# Patient Record
Sex: Female | Born: 1935 | Race: White | Hispanic: No | Marital: Single | Smoking: Never smoker
Health system: Southern US, Community
[De-identification: ages and names within clinical notes are randomized; demographics above are authoritative.]

## PROBLEM LIST (undated history)

## (undated) DIAGNOSIS — I10 Essential (primary) hypertension: Secondary | ICD-10-CM

## (undated) DIAGNOSIS — R569 Unspecified convulsions: Secondary | ICD-10-CM

## (undated) DIAGNOSIS — E785 Hyperlipidemia, unspecified: Secondary | ICD-10-CM

## (undated) DIAGNOSIS — F0390 Unspecified dementia without behavioral disturbance: Secondary | ICD-10-CM

## (undated) DIAGNOSIS — C4490 Unspecified malignant neoplasm of skin, unspecified: Secondary | ICD-10-CM

## (undated) HISTORY — PX: MITRAL VALVE REPLACEMENT: SHX147

---

## 2016-04-25 ENCOUNTER — Emergency Department (HOSPITAL_COMMUNITY): Payer: Medicare Other

## 2016-04-25 ENCOUNTER — Emergency Department (HOSPITAL_COMMUNITY)
Admission: EM | Admit: 2016-04-25 | Discharge: 2016-04-25 | Disposition: A | Discharge status: Home / Self Care | Payer: Medicare Other | Attending: Emergency Medicine | Admitting: Emergency Medicine

## 2016-04-25 ENCOUNTER — Encounter (HOSPITAL_COMMUNITY): Payer: Self-pay | Admitting: Emergency Medicine

## 2016-04-25 DIAGNOSIS — R791 Abnormal coagulation profile: Secondary | ICD-10-CM | POA: Insufficient documentation

## 2016-04-25 DIAGNOSIS — Z7901 Long term (current) use of anticoagulants: Secondary | ICD-10-CM | POA: Insufficient documentation

## 2016-04-25 DIAGNOSIS — R600 Localized edema: Secondary | ICD-10-CM | POA: Insufficient documentation

## 2016-04-25 DIAGNOSIS — N289 Disorder of kidney and ureter, unspecified: Secondary | ICD-10-CM | POA: Diagnosis not present

## 2016-04-25 DIAGNOSIS — R609 Edema, unspecified: Secondary | ICD-10-CM

## 2016-04-25 DIAGNOSIS — Z79899 Other long term (current) drug therapy: Secondary | ICD-10-CM | POA: Diagnosis not present

## 2016-04-25 DIAGNOSIS — I10 Essential (primary) hypertension: Secondary | ICD-10-CM | POA: Insufficient documentation

## 2016-04-25 DIAGNOSIS — M7989 Other specified soft tissue disorders: Secondary | ICD-10-CM | POA: Diagnosis present

## 2016-04-25 HISTORY — DX: Essential (primary) hypertension: I10

## 2016-04-25 HISTORY — DX: Unspecified dementia, unspecified severity, without behavioral disturbance, psychotic disturbance, mood disturbance, and anxiety: F03.90

## 2016-04-25 LAB — CBC WITH DIFFERENTIAL/PLATELET
BASOS ABS: 0.1 10*3/uL (ref 0.0–0.1)
BASOS PCT: 1 %
EOS PCT: 4 %
Eosinophils Absolute: 0.2 10*3/uL (ref 0.0–0.7)
HCT: 38 % (ref 36.0–46.0)
Hemoglobin: 12 g/dL (ref 12.0–15.0)
Lymphocytes Relative: 19 %
Lymphs Abs: 1.2 10*3/uL (ref 0.7–4.0)
MCH: 28.2 pg (ref 26.0–34.0)
MCHC: 31.6 g/dL (ref 30.0–36.0)
MCV: 89.4 fL (ref 78.0–100.0)
MONO ABS: 0.5 10*3/uL (ref 0.1–1.0)
Monocytes Relative: 8 %
Neutro Abs: 4.2 10*3/uL (ref 1.7–7.7)
Neutrophils Relative %: 68 %
PLATELETS: 326 10*3/uL (ref 150–400)
RBC: 4.25 MIL/uL (ref 3.87–5.11)
RDW: 15 % (ref 11.5–15.5)
WBC: 6.2 10*3/uL (ref 4.0–10.5)

## 2016-04-25 LAB — URINALYSIS, ROUTINE W REFLEX MICROSCOPIC
Bilirubin Urine: NEGATIVE
GLUCOSE, UA: NEGATIVE mg/dL
Hgb urine dipstick: NEGATIVE
Ketones, ur: NEGATIVE mg/dL
LEUKOCYTES UA: NEGATIVE
Nitrite: NEGATIVE
PH: 5 (ref 5.0–8.0)
Protein, ur: NEGATIVE mg/dL
SPECIFIC GRAVITY, URINE: 1.016 (ref 1.005–1.030)

## 2016-04-25 LAB — COMPREHENSIVE METABOLIC PANEL
ALT: 15 U/L (ref 14–54)
AST: 32 U/L (ref 15–41)
Albumin: 3.4 g/dL — ABNORMAL LOW (ref 3.5–5.0)
Alkaline Phosphatase: 120 U/L (ref 38–126)
Anion gap: 6 (ref 5–15)
BILIRUBIN TOTAL: 1 mg/dL (ref 0.3–1.2)
BUN: 32 mg/dL — AB (ref 6–20)
CO2: 26 mmol/L (ref 22–32)
CREATININE: 1.38 mg/dL — AB (ref 0.44–1.00)
Calcium: 8.8 mg/dL — ABNORMAL LOW (ref 8.9–10.3)
Chloride: 109 mmol/L (ref 101–111)
GFR calc Af Amer: 39 mL/min — ABNORMAL LOW (ref >60)
GFR, EST NON AFRICAN AMERICAN: 33 mL/min — AB (ref >60)
GLUCOSE: 104 mg/dL — AB (ref 65–99)
Potassium: 3.7 mmol/L (ref 3.5–5.1)
Sodium: 141 mmol/L (ref 135–145)
TOTAL PROTEIN: 7.1 g/dL (ref 6.5–8.1)

## 2016-04-25 LAB — BRAIN NATRIURETIC PEPTIDE: B Natriuretic Peptide: 106.6 pg/mL — ABNORMAL HIGH (ref 0.0–100.0)

## 2016-04-25 LAB — PROTIME-INR
INR: 1.49
PROTHROMBIN TIME: 18.2 s — AB (ref 11.4–15.2)

## 2016-04-25 NOTE — ED Notes (Signed)
No questions at time of discharge. Discharge information given to family and [facility]

## 2016-04-25 NOTE — ED Provider Notes (Signed)
[facility] DEPT Provider Note   CSN: [id number] Arrival date & time: 04/25/16  1833     History   Chief Complaint Chief Complaint  Patient presents with  . Trouble Urinating  . Leg Swelling    HPI Lisa Maddox is a 83 y.o. female.  HPI level V caveat dementia history is obtained the patient's son. Patient son speaks fluent polish. Patient speaks [place] We attempted to get professional medical interpreter however interpreter line malfunctioned no interpreter available. Son acted as Astronomer. Patient has had difficulty urinating for the past 2-3 days. Call urinating small frequent amounts. Patient's son has also noticed bilateral leg swelling for the past 2 days. Patient states she feels well. She denies any difficulty breathing. Denies cough. Denies pain anywhere. She moved to [place] to assisted-living facility 1 month ago from [place].  Past Medical History:  Diagnosis Date  . Dementia   . Hypertension     There are no active problems to display for this patient.   Past Surgical History:  Procedure Laterality Date  . MITRAL VALVE REPLACEMENT      OB History    No data available       Home Medications    Prior to Admission medications   Medication Sig Start Date End Date Taking? Authorizing Provider  acetaminophen (TYLENOL) 500 MG tablet Take 500 mg by mouth every 4 (four) hours as needed for mild pain, fever or headache.   Yes Historical Provider, MD  alum & mag hydroxide-simeth (MINTOX) 200-200-20 MG/5ML suspension Take 30 mLs by mouth as needed for indigestion or heartburn.   Yes Historical Provider, MD  atorvastatin (LIPITOR) 10 MG tablet Take 10 mg by mouth daily.   Yes Historical Provider, MD  chlorthalidone (HYGROTON) 50 MG tablet Take 50 mg by mouth daily.   Yes Historical Provider, MD  guaifenesin (ROBITUSSIN) 100 MG/5ML syrup Take 200 mg by mouth every 6 (six) hours as needed for cough.   Yes Historical Provider, MD  levETIRAcetam (KEPPRA) 250  MG tablet Take 250 mg by mouth 2 (two) times daily.   Yes Historical Provider, MD  loperamide (IMODIUM) 2 MG capsule Take 2 mg by mouth as needed for diarrhea or loose stools.   Yes Historical Provider, MD  losartan (COZAAR) 50 MG tablet Take 50 mg by mouth daily.   Yes Historical Provider, MD  magnesium hydroxide (MILK OF MAGNESIA) 400 MG/5ML suspension Take 30 mLs by mouth at bedtime as needed for mild constipation.   Yes Historical Provider, MD  memantine (NAMENDA) 5 MG tablet Take 5 mg by mouth 2 (two) times daily.   Yes Historical Provider, MD  metoprolol succinate (TOPROL-XL) 50 MG 24 hr tablet Take 75 mg by mouth daily. Take with or immediately following a meal.   Yes Historical Provider, MD  Multiple Vitamins-Minerals (PRESERVISION AREDS PO) Take 1 capsule by mouth 2 (two) times daily.   Yes Historical Provider, MD  neomycin-bacitracin-polymyxin (NEOSPORIN) [phone] ointment Apply 1 application topically as needed (skin tears, abrasions).   Yes Historical Provider, MD  PRESCRIPTION MEDICATION Take 1 Can by mouth 2 (two) times daily with a meal. Mighty Shakes   Yes Historical Provider, MD  spironolactone (ALDACTONE) 25 MG tablet Take 25 mg by mouth daily.   Yes Historical Provider, MD  warfarin (COUMADIN) 3 MG tablet Take 3 mg by mouth 3 (three) times a week. Take 3mg  by mouth on Tuesday, Thursday, and Saturday   Yes Historical Provider, MD  warfarin (COUMADIN) 4 MG tablet Take  4 mg by mouth as directed. Take 4mg  by mouth on Sunday, Monday, Wednesday, and Friday   Yes Historical Provider, MD    Family History No family history on file.  Social History Social History  Substance Use Topics  . Smoking status: Never Smoker  . Smokeless tobacco: Never Used  . Alcohol use No   Lives in assisted living facility  Allergies   Patient has no known allergies.   Review of Systems Review of Systems  Unable to perform ROS: Dementia  Cardiovascular: Positive for leg swelling.    Genitourinary: Positive for dysuria.     Physical Exam Updated Vital Signs BP 120/88 (BP Location: Left Arm)   Pulse 78   Temp 97.7 F (36.5 C) (Oral)   Resp 16   Ht 5\' 1"  (1.549 m)   Wt 137 lb 1.6 oz (62.2 kg)   SpO2 97%   BMI 25.90 kg/m   Physical Exam  Constitutional: She appears well-developed and well-nourished. No distress.  Pleasant and cooperative  HENT:  Head: Normocephalic and atraumatic.  Eyes: Conjunctivae are normal. Pupils are equal, round, and reactive to light.  Neck: Neck supple. No tracheal deviation present. No thyromegaly present.  Cardiovascular: Normal rate and regular rhythm.   No murmur heard. Pulmonary/Chest: Effort normal and breath sounds normal.  Abdominal: Soft. Bowel sounds are normal. She exhibits no distension. There is no tenderness.  Musculoskeletal: Normal range of motion. She exhibits edema. She exhibits no tenderness.  +1 pretibial pitting edema bilaterally  Neurological: She is alert. Coordination normal.  Skin: Skin is warm and dry. No rash noted.  Psychiatric: She has a normal mood and affect.  Nursing note and vitals reviewed.    ED Treatments / Results  Labs (all labs ordered are listed, but only abnormal results are displayed) Labs Reviewed  URINALYSIS, ROUTINE W REFLEX MICROSCOPIC    EKG  EKG Interpretation None      Chest x-ray viewed by me Radiology Dg Chest 2 View  Result Date: 04/25/2016 CLINICAL DATA:  Acute onset of difficulty urinating. Pitting edema. Initial encounter. EXAM: CHEST  2 VIEW COMPARISON:  None. FINDINGS: The lungs are hypoexpanded. Peribronchial thickening is noted. Minimal left basilar opacity could reflect mild pneumonia, depending on the patient's symptoms. There is no evidence of pleural effusion or pneumothorax. The heart is mildly enlarged. The patient is status post median sternotomy. No acute osseous abnormalities are seen. IMPRESSION: 1. Lungs hypoexpanded. Peribronchial thickening.  Minimal left basilar opacity could reflect mild pneumonia, depending on the patient's symptoms. 2. Mild cardiomegaly. Electronically Signed   By: Garald Balding M.D.   On: 04/25/2016 19:36    Procedures Procedures (including critical care time)  Medications Ordered in ED Medications - No data to display   Results for orders placed or performed during the hospital encounter of 04/25/16  Urinalysis, Routine w reflex microscopic- may I&O cath if menses  Result Value Ref Range   Color, Urine YELLOW YELLOW   APPearance CLEAR CLEAR   Specific Gravity, Urine 1.016 1.005 - 1.030   pH 5.0 5.0 - 8.0   Glucose, UA NEGATIVE NEGATIVE mg/dL   Hgb urine dipstick NEGATIVE NEGATIVE   Bilirubin Urine NEGATIVE NEGATIVE   Ketones, ur NEGATIVE NEGATIVE mg/dL   Protein, ur NEGATIVE NEGATIVE mg/dL   Nitrite NEGATIVE NEGATIVE   Leukocytes, UA NEGATIVE NEGATIVE  Comprehensive metabolic panel  Result Value Ref Range   Sodium 141 135 - 145 mmol/L   Potassium 3.7 3.5 - 5.1 mmol/L  Chloride 109 101 - 111 mmol/L   CO2 26 22 - 32 mmol/L   Glucose, Bld 104 (H) 65 - 99 mg/dL   BUN 32 (H) 6 - 20 mg/dL   Creatinine, Ser 1.38 (H) 0.44 - 1.00 mg/dL   Calcium 8.8 (L) 8.9 - 10.3 mg/dL   Total Protein 7.1 6.5 - 8.1 g/dL   Albumin 3.4 (L) 3.5 - 5.0 g/dL   AST 32 15 - 41 U/L   ALT 15 14 - 54 U/L   Alkaline Phosphatase 120 38 - 126 U/L   Total Bilirubin 1.0 0.3 - 1.2 mg/dL   GFR calc non Af Amer 33 (L) >60 mL/min   GFR calc Af Amer 39 (L) >60 mL/min   Anion gap 6 5 - 15  CBC with Differential/Platelet  Result Value Ref Range   WBC 6.2 4.0 - 10.5 K/uL   RBC 4.25 3.87 - 5.11 MIL/uL   Hemoglobin 12.0 12.0 - 15.0 g/dL   HCT 38.0 36.0 - 46.0 %   MCV 89.4 78.0 - 100.0 fL   MCH 28.2 26.0 - 34.0 pg   MCHC 31.6 30.0 - 36.0 g/dL   RDW 15.0 11.5 - 15.5 %   Platelets 326 150 - 400 K/uL   Neutrophils Relative % 68 %   Neutro Abs 4.2 1.7 - 7.7 K/uL   Lymphocytes Relative 19 %   Lymphs Abs 1.2 0.7 - 4.0 K/uL    Monocytes Relative 8 %   Monocytes Absolute 0.5 0.1 - 1.0 K/uL   Eosinophils Relative 4 %   Eosinophils Absolute 0.2 0.0 - 0.7 K/uL   Basophils Relative 1 %   Basophils Absolute 0.1 0.0 - 0.1 K/uL  Brain natriuretic peptide  Result Value Ref Range   B Natriuretic Peptide 106.6 (H) 0.0 - 100.0 pg/mL  Protime-INR  Result Value Ref Range   Prothrombin Time 18.2 (H) 11.4 - 15.2 seconds   INR 1.49    Dg Chest 2 View  Result Date: 04/25/2016 CLINICAL DATA:  Acute onset of difficulty urinating. Pitting edema. Initial encounter. EXAM: CHEST  2 VIEW COMPARISON:  None. FINDINGS: The lungs are hypoexpanded. Peribronchial thickening is noted. Minimal left basilar opacity could reflect mild pneumonia, depending on the patient's symptoms. There is no evidence of pleural effusion or pneumothorax. The heart is mildly enlarged. The patient is status post median sternotomy. No acute osseous abnormalities are seen. IMPRESSION: 1. Lungs hypoexpanded. Peribronchial thickening. Minimal left basilar opacity could reflect mild pneumonia, depending on the patient's symptoms. 2. Mild cardiomegaly. Electronically Signed   By: Garald Balding M.D.   On: 04/25/2016 19:36    Initial Impression / Assessment and Plan / ED Course  I have reviewed the triage vital signs and the nursing notes.  Pertinent labs & imaging results that were available during my care of the patient were reviewed by me and considered in my medical decision making (see chart for details).     Chest x-ray revealed poor inspiration. Doubt pneumonia. No fever no cough lungs clear to auscultation. Normal respiratory rate normal pulse oximetry 11:10 PM patient resting comfortably. No distress. INR subtherapeutic. Chest x-ray shows that she has likely tissue valve not, not mechanical valve. It's unclear why patient is currently on warfarin. She also has mild renal insufficiency. No comparison. I had a conversation with Dr. Eddie Dibbles,, cardiologist on call. No  need for urgent cardiology office visit visit or obstipation. Patient fractured follow-up with her new PMD. No evidence of UTI Final Clinical Impressions(s) /  ED Diagnoses  Diagnosis #1 peripheral edema #2 mild renal insufficiency #3 subtherapeutic INR Final diagnoses:  None    New Prescriptions New Prescriptions   No medications on file     Orlie Dakin, MD 04/26/16 7588

## 2016-04-25 NOTE — ED Triage Notes (Signed)
Pt presents with son from [facility] with complaints of trouble urinating.  Son states she will say she needs to go and nothing will be in the toilet and 2 minutes later states she has to go again.  She is urinating very little when she does go.  Bladder scanned at 52 cc.  Pt speaks [place] and some english but son is able to speak both at bedside. Also patient has +2 pitting edema in legs.

## 2016-04-25 NOTE — Discharge Instructions (Signed)
Call Ms. Lisa Maddox physician or physician's assistant on Monday, April 9, 2018to schedule next available appointment in order discussed regulation of her medications. Get her medical records from [place]. Her blood creatinine, which is sure of her kidney function is mildly elevated at 1.38. Her INR, a measure of her Coumadin level is slightly low tonight at 1.49. Her medications may need to be adjusted. Her leg swelling will hopefully go down after several weeks with elevation of her legs above her heart and perhaps support stockings. Return if her condition worsens for any reason

## 2016-06-01 ENCOUNTER — Emergency Department (HOSPITAL_COMMUNITY)
Admission: EM | Admit: 2016-06-01 | Discharge: 2016-06-01 | Disposition: A | Discharge status: Home / Self Care | Payer: Medicare Other | Attending: Emergency Medicine | Admitting: Emergency Medicine

## 2016-06-01 ENCOUNTER — Emergency Department (HOSPITAL_COMMUNITY): Payer: Medicare Other

## 2016-06-01 ENCOUNTER — Encounter (HOSPITAL_COMMUNITY): Payer: Self-pay

## 2016-06-01 DIAGNOSIS — Z7901 Long term (current) use of anticoagulants: Secondary | ICD-10-CM | POA: Diagnosis not present

## 2016-06-01 DIAGNOSIS — I10 Essential (primary) hypertension: Secondary | ICD-10-CM | POA: Diagnosis not present

## 2016-06-01 DIAGNOSIS — S0083XA Contusion of other part of head, initial encounter: Secondary | ICD-10-CM | POA: Diagnosis not present

## 2016-06-01 DIAGNOSIS — W01198A Fall on same level from slipping, tripping and stumbling with subsequent striking against other object, initial encounter: Secondary | ICD-10-CM | POA: Diagnosis not present

## 2016-06-01 DIAGNOSIS — Z79899 Other long term (current) drug therapy: Secondary | ICD-10-CM | POA: Diagnosis not present

## 2016-06-01 DIAGNOSIS — Y92009 Unspecified place in unspecified non-institutional (private) residence as the place of occurrence of the external cause: Secondary | ICD-10-CM | POA: Insufficient documentation

## 2016-06-01 DIAGNOSIS — W19XXXA Unspecified fall, initial encounter: Secondary | ICD-10-CM

## 2016-06-01 DIAGNOSIS — Y999 Unspecified external cause status: Secondary | ICD-10-CM | POA: Insufficient documentation

## 2016-06-01 DIAGNOSIS — S0990XA Unspecified injury of head, initial encounter: Secondary | ICD-10-CM | POA: Diagnosis present

## 2016-06-01 DIAGNOSIS — Y939 Activity, unspecified: Secondary | ICD-10-CM | POA: Diagnosis not present

## 2016-06-01 LAB — CBC WITH DIFFERENTIAL/PLATELET
BASOS ABS: 0 10*3/uL (ref 0.0–0.1)
Basophils Relative: 0 %
EOS PCT: 2 %
Eosinophils Absolute: 0.1 10*3/uL (ref 0.0–0.7)
HCT: 37.8 % (ref 36.0–46.0)
Hemoglobin: 11.8 g/dL — ABNORMAL LOW (ref 12.0–15.0)
LYMPHS PCT: 16 %
Lymphs Abs: 1.3 10*3/uL (ref 0.7–4.0)
MCH: 27.8 pg (ref 26.0–34.0)
MCHC: 31.2 g/dL (ref 30.0–36.0)
MCV: 89.2 fL (ref 78.0–100.0)
MONO ABS: 0.4 10*3/uL (ref 0.1–1.0)
MONOS PCT: 5 %
Neutro Abs: 6.2 10*3/uL (ref 1.7–7.7)
Neutrophils Relative %: 77 %
PLATELETS: 314 10*3/uL (ref 150–400)
RBC: 4.24 MIL/uL (ref 3.87–5.11)
RDW: 14.1 % (ref 11.5–15.5)
WBC: 8.1 10*3/uL (ref 4.0–10.5)

## 2016-06-01 LAB — CBG MONITORING, ED: Glucose-Capillary: 98 mg/dL (ref 65–99)

## 2016-06-01 LAB — COMPREHENSIVE METABOLIC PANEL
ALT: 18 U/L (ref 14–54)
ANION GAP: 10 (ref 5–15)
AST: 30 U/L (ref 15–41)
Albumin: 3.2 g/dL — ABNORMAL LOW (ref 3.5–5.0)
Alkaline Phosphatase: 114 U/L (ref 38–126)
BUN: 30 mg/dL — ABNORMAL HIGH (ref 6–20)
CO2: 24 mmol/L (ref 22–32)
CREATININE: 1.37 mg/dL — AB (ref 0.44–1.00)
Calcium: 8.8 mg/dL — ABNORMAL LOW (ref 8.9–10.3)
Chloride: 106 mmol/L (ref 101–111)
GFR, EST AFRICAN AMERICAN: 39 mL/min — AB (ref >60)
GFR, EST NON AFRICAN AMERICAN: 33 mL/min — AB (ref >60)
GLUCOSE: 111 mg/dL — AB (ref 65–99)
POTASSIUM: 3.7 mmol/L (ref 3.5–5.1)
Sodium: 140 mmol/L (ref 135–145)
Total Bilirubin: 0.9 mg/dL (ref 0.3–1.2)
Total Protein: 6.8 g/dL (ref 6.5–8.1)

## 2016-06-01 MED ORDER — FENTANYL CITRATE (PF) 100 MCG/2ML IJ SOLN
50.0000 ug | Freq: Once | INTRAMUSCULAR | Status: AC
  Administered 2016-06-01: 50 ug via INTRAVENOUS
  Filled 2016-06-01: qty 2

## 2016-06-01 NOTE — ED Triage Notes (Addendum)
Using stratus interpreter pt reports that she was walking to the restroom and slipped on a wet spot on the floor and hit her mouth. Pt reports pain to left elbow.

## 2016-06-01 NOTE — ED Notes (Signed)
Adlene Adduci (son) [phone] call for any needs.

## 2016-06-01 NOTE — ED Triage Notes (Signed)
[facility]- pt coming from [facility] after an unwitnessed fall. She was found to have knocked out her front teeth. Unknown LOC. Pt alert on arrival. Pt only speaks polish.

## 2016-06-02 NOTE — ED Provider Notes (Signed)
[facility] DEPT Provider Note   CSN: [id number] Arrival date & time: 06/01/16  1128     History   Chief Complaint Chief Complaint  Patient presents with  . Fall    HPI Lisa Maddox is a 83 y.o. female.  HPI  Patient has severe dementia and is not able to tell what happened but the son says that he is an impression that she got out of the tub and slipped on some water and fell hitting her face. Unknown if she had any loss of consciousness. Here she is just complaining of some face pain and missing teeth. She has a abrasion to the inner upper lip and a small abrasion to the outer that she is also complaining of. No other complaints.  Past Medical History:  Diagnosis Date  . Dementia   . Hypertension     There are no active problems to display for this patient.   Past Surgical History:  Procedure Laterality Date  . MITRAL VALVE REPLACEMENT      OB History    No data available       Home Medications    Prior to Admission medications   Medication Sig Start Date End Date Taking? Authorizing Provider  acetaminophen (TYLENOL) 500 MG tablet Take 500 mg by mouth every 4 (four) hours as needed for mild pain, fever or headache.    [provider]  alum & mag hydroxide-simeth (MINTOX) 200-200-20 MG/5ML suspension Take 30 mLs by mouth as needed for indigestion or heartburn.    [provider]  atorvastatin (LIPITOR) 10 MG tablet Take 10 mg by mouth daily.    [provider]  chlorthalidone (HYGROTON) 50 MG tablet Take 50 mg by mouth daily.    [provider]  guaifenesin (ROBITUSSIN) 100 MG/5ML syrup Take 200 mg by mouth every 6 (six) hours as needed for cough.    [provider]  levETIRAcetam (KEPPRA) 250 MG tablet Take 250 mg by mouth 2 (two) times daily.    [provider]  loperamide (IMODIUM) 2 MG capsule Take 2 mg by mouth as needed for diarrhea or loose stools.    [provider]  losartan (COZAAR) 50  MG tablet Take 50 mg by mouth daily.    [provider]  magnesium hydroxide (MILK OF MAGNESIA) 400 MG/5ML suspension Take 30 mLs by mouth at bedtime as needed for mild constipation.    [provider]  memantine (NAMENDA) 5 MG tablet Take 5 mg by mouth 2 (two) times daily.    [provider]  metoprolol succinate (TOPROL-XL) 50 MG 24 hr tablet Take 75 mg by mouth daily. Take with or immediately following a meal.    [provider]  Multiple Vitamins-Minerals (PRESERVISION AREDS PO) Take 1 capsule by mouth 2 (two) times daily.    [provider]  neomycin-bacitracin-polymyxin (NEOSPORIN) [phone] ointment Apply 1 application topically as needed (skin tears, abrasions).    [provider]  PRESCRIPTION MEDICATION Take 1 Can by mouth 2 (two) times daily with a meal. Mighty Shakes    [provider]  spironolactone (ALDACTONE) 25 MG tablet Take 25 mg by mouth daily.    [provider]  warfarin (COUMADIN) 3 MG tablet Take 3 mg by mouth 3 (three) times a week. Take 3mg  by mouth on Tuesday, Thursday, and Saturday    [provider]  warfarin (COUMADIN) 4 MG tablet Take 4 mg by mouth as directed. Take 4mg  by mouth on Sunday, Monday,  Wednesday, and Friday    [provider]    Family History History reviewed. No pertinent family history.  Social History Social History  Substance Use Topics  . Smoking status: Never Smoker  . Smokeless tobacco: Never Used  . Alcohol use No     Allergies   Patient has no known allergies.   Review of Systems Review of Systems  Unable to perform ROS: Dementia     Physical Exam Updated Vital Signs BP (!) 168/78   Pulse 79   Resp 16   SpO2 99%   Physical Exam  Constitutional: She appears well-developed and well-nourished.  HENT:  Head: Normocephalic and atraumatic.  Abrasion without laceration to inner upper lip  Two broken teeth but one does not appear to be  new.  Eyes: Conjunctivae and EOM are normal.  Neck: Normal range of motion.  Cardiovascular: Normal rate and regular rhythm.   Pulmonary/Chest: No stridor. No respiratory distress.  Abdominal: Soft. She exhibits no distension.  Musculoskeletal: Normal range of motion. She exhibits no edema or tenderness.  Neurological: She is alert.  No altered mental status, able to give full seemingly accurate history.  Face is symmetric, EOM's intact, pupils equal and reactive, vision intact, tongue and uvula midline without deviation Upper and Lower extremity motor 5/5, intact pain perception in distal extremities, 2+ reflexes in biceps, patella and achilles tendons. Finger to nose normal, heel to shin normal.   Skin: Skin is warm.  Nursing note and vitals reviewed.    ED Treatments / Results  Labs (all labs ordered are listed, but only abnormal results are displayed) Labs Reviewed  CBC WITH DIFFERENTIAL/PLATELET - Abnormal; Notable for the following:       Result Value   Hemoglobin 11.8 (*)    All other components within normal limits  COMPREHENSIVE METABOLIC PANEL - Abnormal; Notable for the following:    Glucose, Bld 111 (*)    BUN 30 (*)    Creatinine, Ser 1.37 (*)    Calcium 8.8 (*)    Albumin 3.2 (*)    GFR calc non Af Amer 33 (*)    GFR calc Af Amer 39 (*)    All other components within normal limits  CBG MONITORING, ED    EKG  EKG Interpretation  Date/Time:  Monday Jun 01 2016 11:41:59 EDT Ventricular Rate:  79 PR Interval:    QRS Duration: 90 QT Interval:  413 QTC Calculation: 474 R Axis:   -11 Text Interpretation:  Sinus rhythm Probable anteroseptal infarct, old Confirmed by [facility] MD, Ardon Franklin [phone]) on 06/01/2016 11:55:41 AM       Radiology Ct Head Wo Contrast  Result Date: 06/01/2016 CLINICAL DATA:  Initial evaluation for acute trauma, fall. EXAM: CT HEAD WITHOUT CONTRAST CT MAXILLOFACIAL WITHOUT CONTRAST TECHNIQUE: Multidetector CT imaging of the head and  maxillofacial structures were performed using the standard protocol without intravenous contrast. Multiplanar CT image reconstructions of the maxillofacial structures were also generated. COMPARISON:  None. FINDINGS: CT HEAD FINDINGS Brain: Diffuse prominence of the CSF containing spaces compatible with generalized cerebral atrophy. Mild to moderate chronic microvascular ischemic disease present within the periventricular and deep white matter both cerebral hemispheres. Encephalomalacia within the right frontotemporal region compatible with remote right MCA territory infarct. Scattered remote bilateral cerebellar infarcts noted as well. No acute intracranial hemorrhage. No evidence for acute large vessel territory infarct. No mass lesion, midline shift or mass effect. Ventricular prominence related to global parenchymal volume loss of hydrocephalus. No extra-axial fluid  collection para Vascular: No asymmetric hyperdense vessel. Scattered vascular calcifications noted within the carotid siphons and distal vertebral arteries. Skull: Scalp soft tissues demonstrate no acute abnormality. Calvarium intact. Other: Trace left mastoid effusion noted. Right mastoid air cells clear. CT MAXILLOFACIAL FINDINGS Osseous: Zygomatic arches intact. No acute maxillary fracture. Pterygoid plates intact. Nasal bones intact. Nasal septum mildly bowed to the left but intact. No acute mandibular fracture. Mandibular condyles normally situated. No acute abnormality about the remaining dentition. Orbits: Globes intact. Intraorbital soft tissues within normal limits. Bony orbits intact. Sinuses: Mild scattered mucosal thickening within the ethmoidal air cells. Paranasal sinuses are otherwise clear. Soft tissues: Mild soft tissue swelling/contusion at the left face. No other appreciable soft tissue injury. IMPRESSION: CT HEAD: 1. No acute intracranial process identified. 2. 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 3. Moderate atrophy with chronic small vessel ischemic disease. CT MAXILLOFACIAL: 1. Mild left facial soft tissue contusion. 2. No other acute maxillofacial injury identified.  No fracture. Electronically Signed   By: Jeannine Boga M.D.   On: 06/01/2016 15:09   Ct Maxillofacial Wo Contrast  Result Date: 06/01/2016 CLINICAL DATA:  Initial evaluation for acute trauma, fall. EXAM: CT HEAD WITHOUT CONTRAST CT MAXILLOFACIAL WITHOUT CONTRAST TECHNIQUE: Multidetector CT imaging of the head and maxillofacial structures were performed using the standard protocol without intravenous contrast. Multiplanar CT image reconstructions of the maxillofacial structures were also generated. COMPARISON:  None. FINDINGS: CT HEAD FINDINGS Brain: Diffuse prominence of the CSF containing spaces compatible with generalized cerebral atrophy. Mild to moderate chronic microvascular ischemic disease present within the periventricular and deep white matter both cerebral hemispheres. Encephalomalacia within the right frontotemporal region compatible with remote right MCA territory infarct. Scattered remote bilateral cerebellar infarcts noted as well. No acute intracranial hemorrhage. No evidence for acute large vessel territory infarct. No mass lesion, midline shift or mass effect. Ventricular prominence related to global parenchymal volume loss of hydrocephalus. No extra-axial fluid collection para Vascular: No asymmetric hyperdense vessel. Scattered vascular calcifications noted within the carotid siphons and distal vertebral arteries. Skull: Scalp soft tissues demonstrate no acute abnormality. Calvarium intact. Other: Trace left mastoid effusion noted. Right mastoid air cells clear. CT MAXILLOFACIAL FINDINGS Osseous: Zygomatic arches intact. No acute maxillary fracture. Pterygoid plates intact. Nasal bones intact. Nasal septum mildly bowed to the left but intact. No acute mandibular fracture. Mandibular condyles  normally situated. No acute abnormality about the remaining dentition. Orbits: Globes intact. Intraorbital soft tissues within normal limits. Bony orbits intact. Sinuses: Mild scattered mucosal thickening within the ethmoidal air cells. Paranasal sinuses are otherwise clear. Soft tissues: Mild soft tissue swelling/contusion at the left face. No other appreciable soft tissue injury. IMPRESSION: CT HEAD: 1. No acute intracranial process identified. 2. Remote right MCA territory infarct with additional remote bilateral cerebellar infarcts. 3. Moderate atrophy with chronic small vessel ischemic disease. CT MAXILLOFACIAL: 1. Mild left facial soft tissue contusion. 2. No other acute maxillofacial injury identified.  No fracture. Electronically Signed   By: Jeannine Boga M.D.   On: 06/01/2016 15:09    Procedures Procedures (including critical care time)  Medications Ordered in ED Medications  fentaNYL (SUBLIMAZE) injection 50 mcg (50 mcg Intravenous Given 06/01/16 1321)     Initial Impression / Assessment and Plan / ED Course  I have reviewed the triage vital signs and the nursing notes.  Pertinent labs & imaging results that were available during my care of the patient were reviewed by me and considered in  my medical decision making (see chart for details).     Workup reassurring. Patient is at her baseline mental status per the son who is at bedside. She has no complaints of pain other than her lip at this time. No lacerations requiring repair. She will need to see dentist for full extraction of her teeth and dentures which the son with plan on doing any way.  Final Clinical Impressions(s) / ED Diagnoses   Final diagnoses:  Fall, initial encounter  Contusion of face, initial encounter      Maveric Debono, Corene Cornea, MD 06/02/16 1626

## 2016-09-03 ENCOUNTER — Encounter (HOSPITAL_COMMUNITY): Payer: Self-pay | Admitting: Nurse Practitioner

## 2016-09-03 ENCOUNTER — Observation Stay (HOSPITAL_COMMUNITY)
Admission: EM | Admit: 2016-09-03 | Discharge: 2016-09-04 | Disposition: A | Discharge status: Home / Self Care | Payer: Medicare Other | Attending: Internal Medicine | Admitting: Internal Medicine

## 2016-09-03 DIAGNOSIS — L03116 Cellulitis of left lower limb: Secondary | ICD-10-CM | POA: Insufficient documentation

## 2016-09-03 DIAGNOSIS — Z85828 Personal history of other malignant neoplasm of skin: Secondary | ICD-10-CM | POA: Insufficient documentation

## 2016-09-03 DIAGNOSIS — Z952 Presence of prosthetic heart valve: Secondary | ICD-10-CM | POA: Insufficient documentation

## 2016-09-03 DIAGNOSIS — F0390 Unspecified dementia without behavioral disturbance: Secondary | ICD-10-CM | POA: Insufficient documentation

## 2016-09-03 DIAGNOSIS — Z66 Do not resuscitate: Secondary | ICD-10-CM | POA: Diagnosis not present

## 2016-09-03 DIAGNOSIS — L03115 Cellulitis of right lower limb: Secondary | ICD-10-CM | POA: Diagnosis not present

## 2016-09-03 DIAGNOSIS — I340 Nonrheumatic mitral (valve) insufficiency: Secondary | ICD-10-CM | POA: Insufficient documentation

## 2016-09-03 DIAGNOSIS — Z7901 Long term (current) use of anticoagulants: Secondary | ICD-10-CM | POA: Diagnosis not present

## 2016-09-03 DIAGNOSIS — E789 Disorder of lipoprotein metabolism, unspecified: Secondary | ICD-10-CM | POA: Diagnosis present

## 2016-09-03 DIAGNOSIS — E785 Hyperlipidemia, unspecified: Secondary | ICD-10-CM | POA: Insufficient documentation

## 2016-09-03 DIAGNOSIS — R7989 Other specified abnormal findings of blood chemistry: Secondary | ICD-10-CM | POA: Diagnosis present

## 2016-09-03 DIAGNOSIS — L02416 Cutaneous abscess of left lower limb: Secondary | ICD-10-CM | POA: Insufficient documentation

## 2016-09-03 DIAGNOSIS — N179 Acute kidney failure, unspecified: Secondary | ICD-10-CM | POA: Insufficient documentation

## 2016-09-03 DIAGNOSIS — Z79899 Other long term (current) drug therapy: Secondary | ICD-10-CM | POA: Insufficient documentation

## 2016-09-03 DIAGNOSIS — I10 Essential (primary) hypertension: Secondary | ICD-10-CM | POA: Diagnosis not present

## 2016-09-03 DIAGNOSIS — I872 Venous insufficiency (chronic) (peripheral): Secondary | ICD-10-CM | POA: Diagnosis not present

## 2016-09-03 HISTORY — DX: Hyperlipidemia, unspecified: E78.5

## 2016-09-03 HISTORY — DX: Unspecified malignant neoplasm of skin, unspecified: C44.90

## 2016-09-03 HISTORY — DX: Unspecified convulsions: R56.9

## 2016-09-03 LAB — MRSA PCR SCREENING: MRSA by PCR: NEGATIVE

## 2016-09-03 LAB — CBC
HCT: 37.3 % (ref 36.0–46.0)
HEMOGLOBIN: 11.9 g/dL — AB (ref 12.0–15.0)
MCH: 27 pg (ref 26.0–34.0)
MCHC: 31.9 g/dL (ref 30.0–36.0)
MCV: 84.6 fL (ref 78.0–100.0)
Platelets: 376 10*3/uL (ref 150–400)
RBC: 4.41 MIL/uL (ref 3.87–5.11)
RDW: 15 % (ref 11.5–15.5)
WBC: 7.6 10*3/uL (ref 4.0–10.5)

## 2016-09-03 LAB — BASIC METABOLIC PANEL
ANION GAP: 8 (ref 5–15)
BUN: 37 mg/dL — AB (ref 6–20)
CHLORIDE: 105 mmol/L (ref 101–111)
CO2: 29 mmol/L (ref 22–32)
Calcium: 9 mg/dL (ref 8.9–10.3)
Creatinine, Ser: 1.72 mg/dL — ABNORMAL HIGH (ref 0.44–1.00)
GFR calc Af Amer: 29 mL/min — ABNORMAL LOW (ref >60)
GFR calc non Af Amer: 25 mL/min — ABNORMAL LOW (ref >60)
Glucose, Bld: 115 mg/dL — ABNORMAL HIGH (ref 65–99)
POTASSIUM: 4.1 mmol/L (ref 3.5–5.1)
SODIUM: 142 mmol/L (ref 135–145)

## 2016-09-03 LAB — PROTIME-INR
INR: 3.12
PROTHROMBIN TIME: 32.8 s — AB (ref 11.4–15.2)

## 2016-09-03 MED ORDER — VANCOMYCIN HCL IN DEXTROSE 1-5 GM/200ML-% IV SOLN
1000.0000 mg | Freq: Once | INTRAVENOUS | Status: AC
  Administered 2016-09-03: 1000 mg via INTRAVENOUS
  Filled 2016-09-03: qty 200

## 2016-09-03 MED ORDER — ONDANSETRON HCL 4 MG PO TABS: 4.0000 mg | ORAL_TABLET | Freq: Four times a day (QID) | ORAL | Status: DC | PRN

## 2016-09-03 MED ORDER — ACETAMINOPHEN 325 MG PO TABS: 650.0000 mg | ORAL_TABLET | Freq: Four times a day (QID) | ORAL | Status: DC | PRN

## 2016-09-03 MED ORDER — SODIUM CHLORIDE 0.9 % IV SOLN
INTRAVENOUS | Status: DC
  Administered 2016-09-03: 1000 mL via INTRAVENOUS
  Administered 2016-09-04: 01:00:00 via INTRAVENOUS

## 2016-09-03 MED ORDER — MEMANTINE HCL 10 MG PO TABS
5.0000 mg | ORAL_TABLET | Freq: Two times a day (BID) | ORAL | Status: DC
  Administered 2016-09-03: 23:00:00 via ORAL
  Administered 2016-09-04: 5 mg via ORAL
  Filled 2016-09-03 (×2): qty 1

## 2016-09-03 MED ORDER — ATORVASTATIN CALCIUM 10 MG PO TABS
10.0000 mg | ORAL_TABLET | Freq: Every day | ORAL | Status: DC
  Administered 2016-09-03 – 2016-09-04 (×2): 10 mg via ORAL
  Filled 2016-09-03 (×2): qty 1

## 2016-09-03 MED ORDER — ACETAMINOPHEN 650 MG RE SUPP: 650.0000 mg | Freq: Four times a day (QID) | RECTAL | Status: DC | PRN

## 2016-09-03 MED ORDER — WARFARIN SODIUM 4 MG PO TABS
4.0000 mg | ORAL_TABLET | ORAL | Status: DC
  Filled 2016-09-03: qty 1

## 2016-09-03 MED ORDER — LEVETIRACETAM 250 MG PO TABS
250.0000 mg | ORAL_TABLET | Freq: Two times a day (BID) | ORAL | Status: DC
Start: 2016-09-03 — End: 2016-09-04
  Administered 2016-09-03 – 2016-09-04 (×2): 250 mg via ORAL
  Filled 2016-09-03 (×2): qty 1

## 2016-09-03 MED ORDER — CHLORTHALIDONE 50 MG PO TABS
50.0000 mg | ORAL_TABLET | Freq: Every day | ORAL | Status: DC
  Administered 2016-09-04: 50 mg via ORAL
  Filled 2016-09-03: qty 1

## 2016-09-03 MED ORDER — ONDANSETRON HCL 4 MG/2ML IJ SOLN: 4.0000 mg | Freq: Four times a day (QID) | INTRAMUSCULAR | Status: DC | PRN

## 2016-09-03 MED ORDER — WARFARIN - PHYSICIAN DOSING INPATIENT: Freq: Every day | Status: DC

## 2016-09-03 MED ORDER — SPIRONOLACTONE 25 MG PO TABS
25.0000 mg | ORAL_TABLET | Freq: Every day | ORAL | Status: DC
  Administered 2016-09-04: 25 mg via ORAL
  Filled 2016-09-03: qty 1

## 2016-09-03 MED ORDER — SODIUM CHLORIDE 0.9 % IV BOLUS (SEPSIS)
250.0000 mL | Freq: Once | INTRAVENOUS | Status: AC
  Administered 2016-09-03: 250 mL via INTRAVENOUS

## 2016-09-03 MED ORDER — SENNOSIDES-DOCUSATE SODIUM 8.6-50 MG PO TABS: 1.0000 | ORAL_TABLET | Freq: Every evening | ORAL | Status: DC | PRN

## 2016-09-03 MED ORDER — CEFAZOLIN SODIUM-DEXTROSE 1-4 GM/50ML-% IV SOLN
1.0000 g | Freq: Once | INTRAVENOUS | Status: AC
  Administered 2016-09-03: 1 g via INTRAVENOUS
  Filled 2016-09-03: qty 50

## 2016-09-03 MED ORDER — DEXTROSE 5 % IV SOLN
500.0000 mg | Freq: Two times a day (BID) | INTRAVENOUS | Status: DC
  Administered 2016-09-04: 500 mg via INTRAVENOUS
  Filled 2016-09-03 (×2): qty 5

## 2016-09-03 MED ORDER — WARFARIN SODIUM 3 MG PO TABS
3.0000 mg | ORAL_TABLET | ORAL | Status: DC
  Administered 2016-09-03: 3 mg via ORAL
  Filled 2016-09-03: qty 1

## 2016-09-03 MED ORDER — CEFAZOLIN SODIUM-DEXTROSE 1-4 GM/50ML-% IV SOLN
1.0000 g | Freq: Three times a day (TID) | INTRAVENOUS | Status: DC
Start: 2016-09-03 — End: 2016-09-03

## 2016-09-03 MED ORDER — CEFAZOLIN SODIUM-DEXTROSE 1-4 GM/50ML-% IV SOLN
1.0000 g | Freq: Three times a day (TID) | INTRAVENOUS | Status: DC
  Filled 2016-09-03: qty 50

## 2016-09-03 MED ORDER — LOSARTAN POTASSIUM 50 MG PO TABS
50.0000 mg | ORAL_TABLET | Freq: Every day | ORAL | Status: DC
  Administered 2016-09-04: 50 mg via ORAL
  Filled 2016-09-03: qty 1

## 2016-09-03 MED ORDER — METOPROLOL SUCCINATE ER 50 MG PO TB24
75.0000 mg | ORAL_TABLET | Freq: Every day | ORAL | Status: DC
  Administered 2016-09-04: 10:00:00 75 mg via ORAL
  Filled 2016-09-03: qty 1

## 2016-09-03 NOTE — ED Notes (Signed)
RN starting line and collecting blood work 

## 2016-09-03 NOTE — H&P (Signed)
History and Physical:    Lisa Maddox   OZD:[id number] DOB: 06-01-1926 DOA: 09/03/2016  Referring MD/provider: PA Alphonzo Grieve  PCP: Sande Brothers, MD   Patient coming from: [facility]  Chief Complaint: Redness of lower extremities  History of Present Illness:   Lisa Maddox is an 83 y.o. female with past medical history significant for hypertension, mitral valve replacement and dementia who was recently moved to [place] from [place] 3 months ago by her son for dementia is sent in from [facility] for lower extremity erythema. History is from the son as patient has dementia and speaks only [place]  Per patient's son, patient was noted to have lower extremity erythema bilaterally 4 days ago by nursing staff at [facility]. He was sent to picture of her legs which she showed to me which shows marketed circumferential erythema in both legs from knees to ankles along with marked edema. He was told that she likely had cellulitis. She was started on by mouth Keflex and Lasix 20 mg by mouth daily by her PA Marlowe Kays who is herPCP at [facility] and appeared to be improving. However patient's son felt that she needed to be seen in the ED for cellulitis and she was brought in for evaluation.  Patient herself states she feels well in translation from her son. She denies any pain or discomfort at all.  ED Course:  The patient was diagnosed with cellulitis and given a dose of vancomycin.  ROS:   ROS patient has advanced dementia so is unable to provide  Past Medical History:   Past Medical History:  Diagnosis Date  . Dementia   . Hyperlipidemia   . Hypertension   . Seizures ([place])    focal  . Skin cancer     Past Surgical History:   Past Surgical History:  Procedure Laterality Date  . MITRAL VALVE REPLACEMENT      Social History:   Social History   Social History  . Marital status: Single    Spouse name: N/A  . Number of children: N/A  . Years  of education: N/A   Occupational History  . Not on file.   Social History Main Topics  . Smoking status: Never Smoker  . Smokeless tobacco: Never Used  . Alcohol use No  . Drug use: No  . Sexual activity: No   Other Topics Concern  . Not on file   Social History Narrative  . No narrative on file    Allergies   Patient has no known allergies.  Family history:   History reviewed. No pertinent family history.  Current Medications:   Prior to Admission medications   Medication Sig Start Date End Date Taking? Authorizing Provider  acetaminophen (TYLENOL) 500 MG tablet Take 500 mg by mouth every 4 (four) hours as needed for mild pain, fever or headache.   Yes [provider]  alum & mag hydroxide-simeth (MINTOX) 200-200-20 MG/5ML suspension Take 30 mLs by mouth as needed for indigestion or heartburn.   Yes [provider]  atorvastatin (LIPITOR) 10 MG tablet Take 10 mg by mouth daily.   Yes [provider]  cephALEXin (KEFLEX) 500 MG capsule Take 500 mg by mouth 3 (three) times daily.   Yes [provider]  chlorthalidone (HYGROTON) 50 MG tablet Take 50 mg by mouth daily.   Yes [provider]  furosemide (LASIX) 20 MG tablet Take 20 mg by mouth daily.   Yes [provider]  guaifenesin (ROBITUSSIN) 100 MG/5ML syrup Take 200 mg by mouth every 6 (six) hours as needed for cough.   Yes [provider]  levETIRAcetam (KEPPRA) 250 MG tablet Take 250 mg by mouth 2 (two) times daily.   Yes [provider]  loperamide (IMODIUM) 2 MG capsule Take 2 mg by mouth as needed for diarrhea or loose stools.   Yes [provider]  LORazepam (ATIVAN) 0.5 MG tablet Take 0.5 mg by mouth every Monday, Wednesday, and Friday. Give .5mg  by mouth every Monday, Wednesday, and Friday 30 minutes prior to shower   Yes [provider]  losartan (COZAAR) 50 MG tablet Take 50 mg by mouth daily.   Yes [provider]    magnesium hydroxide (MILK OF MAGNESIA) 400 MG/5ML suspension Take 30 mLs by mouth at bedtime as needed for mild constipation.   Yes [provider]  memantine (NAMENDA) 5 MG tablet Take 5 mg by mouth 2 (two) times daily.   Yes [provider]  metoprolol succinate (TOPROL-XL) 50 MG 24 hr tablet Take 75 mg by mouth daily. Take with or immediately following a meal.   Yes [provider]  Multiple Vitamins-Minerals (PRESERVISION AREDS PO) Take 1 capsule by mouth 2 (two) times daily.   Yes [provider]  neomycin-bacitracin-polymyxin (NEOSPORIN) [phone] ointment Apply 1 application topically as needed (skin tears, abrasions).   Yes [provider]  Nutritional Supplements (NUTRITIONAL SHAKE PO) Take 1 Can by mouth 2 (two) times daily with a meal. Mighty shake   Yes [provider]  spironolactone (ALDACTONE) 25 MG tablet Take 25 mg by mouth daily.   Yes [provider]  warfarin (COUMADIN) 3 MG tablet Take 3 mg by mouth 2 (two) times a week. Take 3mg  by mouth on Monday and Thursday   Yes [provider]  warfarin (COUMADIN) 4 MG tablet Take 4 mg by mouth as directed. Take 4mg  by mouth on Sunday, Tuesday, Wednesday, Friday, and Saturday   Yes [provider]    Physical Exam:   Vitals:   09/03/16 1012 09/03/16 1013 09/03/16 1014  BP: 139/69  133/62  Pulse: 90  66  Resp: 16  16  Temp: 97.7 F (36.5 C)  97.7 F (36.5 C)  TempSrc: Oral  Oral  SpO2: 99%  100%  Weight:  60.8 kg (134 lb)   Height:  5' (1.524 m)      Physical Exam: Blood pressure 133/62, pulse 66, temperature 97.7 F (36.5 C), temperature source Oral, resp. rate 16, height 5' (1.524 m), weight 60.8 kg (134 lb), SpO2 100 %. Gen: Relatively well-appearing female with a blank look sitting up in bed in no distress  Head: Normocephalic, atraumatic. Eyes: Pupils equal, round and reactive to light. Sclerae nonicteric. Mouth: Oropharynx reveals  moist mucous membranes Neck: no jugular venous distention. Chest: Lungs are clear to auscultation with good air movement. No rales, rhonchi or wheezes.  CV: Irregular heart sounds with mechanical S1 and no murmur.  Abdomen: Soft, nontender, nondistended with normal active bowel sounds.  Extremities: 2-3+ edema bilaterally. Skin: Left lower extremity with circumferential erythema from just below knee to just above ankle. It is not particularly warm and it is not tender. Right lower extremity with mild circumferential erythema from mid calf to just above ankle. No pustules or purulence noted. Neuro: Alert and oriented only to person. Grossly nonfocal. Psych: Advanced dementia  Data Review:    Labs: Basic Metabolic Panel:  Recent Labs  Lab 09/03/16 1107  NA 142  K 4.1  CL 105  CO2 29  GLUCOSE 115*  BUN 37*  CREATININE 1.72*  CALCIUM 9.0   Liver Function Tests: No results for input(s): AST, ALT, ALKPHOS, BILITOT, PROT, ALBUMIN in the last 168 hours. No results for input(s): LIPASE, AMYLASE in the last 168 hours. No results for input(s): AMMONIA in the last 168 hours. CBC:  Recent Labs Lab 09/03/16 1107  WBC 7.6  HGB 11.9*  HCT 37.3  MCV 84.6  PLT 376   Cardiac Enzymes: No results for input(s): CKTOTAL, CKMB, CKMBINDEX, TROPONINI in the last 168 hours.  BNP (last 3 results) No results for input(s): PROBNP in the last 8760 hours. CBG: No results for input(s): GLUCAP in the last 168 hours.  Urinalysis    Component Value Date/Time   COLORURINE YELLOW 04/25/2016 2109   APPEARANCEUR CLEAR 04/25/2016 2109   LABSPEC 1.016 04/25/2016 2109   PHURINE 5.0 04/25/2016 2109   GLUCOSEU NEGATIVE 04/25/2016 2109   HGBUR NEGATIVE 04/25/2016 2109   [place] NEGATIVE 04/25/2016 2109   [place] NEGATIVE 04/25/2016 2109   PROTEINUR NEGATIVE 04/25/2016 2109   NITRITE NEGATIVE 04/25/2016 2109   LEUKOCYTESUR NEGATIVE 04/25/2016 2109      Radiographic Studies: No results  found.  EKG: Ordered   Assessment/Plan:   Principal Problem:   Cellulitis and abscess of left leg Active Problems:   HTN (hypertension)   Lipid disorder   Dementia   AKI (acute kidney injury) ([place])  CELLULITIS VS STASIS DERMATITIS Patient is to be admitted for cellulitis. However given no fever, no leukocytosis, no warmth or tenderness and bilateral involvement, I suspect there is at least a component of stasis dermatitis. On the other hand patient does have significantly increased redness on the left compared to the right so she may well have a left leg cellulitis overlying baseline stasis dermatitis. She has been treated with both Lasix and Keflex at [facility] so I am not sure whether it is that Lasix or the Keflex that was effective for right lower extremity erythema.  I will treat patient for mild non-purulent cellulitis of the left lower extremity with Cefazolin to be dosed by pharmacy given new acute kidney injury. If there is not marked improvement of erythema within 24-36 hours, would consider discontinuing Cefazolin and continuing treatment for stasis dermatitis.  AKI Patient had been initiated on Lasix and last 3 days at [facility] for treatment of lower extremity edema. I suspect this may be contributing to her acute kidney injury. Will hold Lasix and provide very gentle IV hydration.  LE EDEMA Patient does not seem to tolerate Lasix very well given worsened creatinine. She may benefit from wrapping of her legs as an outpatient.  CARDIAC Patient appears to be on heart failure medications including spironolactone, losartan and Toprol-XL. She is compensated with clear lungs. Given history of rheumatic heart disease status post mitral valve replacement, I suspect she has some level of systolic heart failure. Will order echocardiogram for further investigation and management.  Continue warfarin for MVR. Pharmacy consultation is requested for management.  HTN Continue  Toprol-XL, losartan and chlorthalidone  DEMENTIA Continue Namenda. Patient apparently does well with Ativan half an hour prior to bathing when necessary and this is continued.  MEDICATION RECONCILIATION It is unclear why patient is on Keppra. I've a phone call into Dr. Hayden Rasmussen her PCP in [place] for clarification. His phone number is [phone]. I have continued the medication for now.  Patient is DO NOT RESUSCITATE  Other information:   DVT prophylaxis: Patient is on warfarin Code Status: DO NOT RESUSCITATE Family Communication: Patient's son assisted with history and was present throughout the entire encounter.  Disposition Plan: [place] called: None Admission status: Observation  The medical decision making on this patient was of high complexity and the patient is at high risk for clinical deterioration, therefore this is a level 3 visit.  The medical decision making is of moderate complexity, therefore this is a level 2 visit.  Dewaine Oats Derek Jack [facility] Pager [phone] Cell: [phone]   If 7PM-7AM, please contact night-coverage [email or web address] Password [email or web address] 09/03/2016, 1:49 PM

## 2016-09-03 NOTE — Progress Notes (Signed)
Pharmacy Note   A consult was received from an ED physician for vancomycin per pharmacy dosing.  The patient's profile has been reviewed for ht/wt/allergies/indication/available labs.     A one time order has been placed for vancomycin 1000 mg IV x 1 .  Further antibiotics/pharmacy consults should be ordered by admitting physician if indicated.                       Thank you,   Royetta Asal, PharmD, BCPS Pager [phone] 09/03/2016 11:03 AM

## 2016-09-03 NOTE — ED Notes (Signed)
Bed: [facility] Expected date:  Expected time:  Means of arrival:  Comments: 

## 2016-09-03 NOTE — Progress Notes (Signed)
Pharmacy Antibiotic Note  Derisha Funderburke is a 83 y.o. female with bilateral lower extremity erythema on kelfex (started on 8/14) PTA for cellulitis.  She presented to the ED on 09/03/2016 for further elevation and management of infection.  To start ancef for suspected cellulitis.  -  afeb, wbc wnl, scr 1.72 (crcl~18- 21) - patient got 1gm of vancomycin in the ED at ~1100  Plan: - ancef 1000 mg IV x1, then 500 mg IV q12h - monitor renal function closely and adjust dose if/when appropriate  _______________________________  Height: 5' (152.4 cm) Weight: 134 lb (60.8 kg) IBW/kg (Calculated) : 45.5  Temp (24hrs), Avg:97.7 F (36.5 C), Min:97.7 F (36.5 C), Max:97.7 F (36.5 C)   Recent Labs Lab 09/03/16 1107  WBC 7.6  CREATININE 1.72*    Estimated Creatinine Clearance: 18.4 mL/min (A) (by C-G formula based on SCr of 1.72 mg/dL (H)).    No Known Allergies   Thank you for allowing pharmacy to be a part of this patient's care.  Lynelle Doctor 09/03/2016 2:47 PM

## 2016-09-03 NOTE — ED Provider Notes (Signed)
[facility] DEPT Provider Note   CSN: [id number] Arrival date & time: 09/03/16  1002     History   Chief Complaint No chief complaint on file.   HPI Lisa Maddox is a 83 y.o. female presenting with left lower leg redness.  Level V caveat as patient has dementia and speaks [place]  Per EMS, patient presenting from [facility], where staff first noticed redness of her left lower leg on Friday. There was no improvement over the weekend, and she was started on Keflex on the 14th. There has been no improvement on antibiotics, and redness has worsened and started to spread to her right lower leg. No fevers or chills, nausea or vomiting. No falls, trauma, or injury.  HPI  Past Medical History:  Diagnosis Date  . Dementia   . Hyperlipidemia   . Hypertension   . Seizures ([place])    focal  . Skin cancer     Patient Active Problem List   Diagnosis Date Noted  . Cellulitis and abscess of left leg 09/03/2016  . HTN (hypertension) 09/03/2016  . Lipid disorder 09/03/2016  . Dementia 09/03/2016  . AKI (acute kidney injury) ([place]) 09/03/2016    Past Surgical History:  Procedure Laterality Date  . MITRAL VALVE REPLACEMENT      OB History    No data available       Home Medications    Prior to Admission medications   Medication Sig Start Date End Date Taking? Authorizing Provider  acetaminophen (TYLENOL) 500 MG tablet Take 500 mg by mouth every 4 (four) hours as needed for mild pain, fever or headache.   Yes [provider]  alum & mag hydroxide-simeth (MINTOX) 200-200-20 MG/5ML suspension Take 30 mLs by mouth as needed for indigestion or heartburn.   Yes [provider]  atorvastatin (LIPITOR) 10 MG tablet Take 10 mg by mouth daily.   Yes [provider]  cephALEXin (KEFLEX) 500 MG capsule Take 500 mg by mouth 3 (three) times daily.   Yes [provider]  chlorthalidone (HYGROTON) 50 MG tablet Take 50 mg by mouth daily.   Yes  [provider]  furosemide (LASIX) 20 MG tablet Take 20 mg by mouth daily.   Yes [provider]  guaifenesin (ROBITUSSIN) 100 MG/5ML syrup Take 200 mg by mouth every 6 (six) hours as needed for cough.   Yes [provider]  levETIRAcetam (KEPPRA) 250 MG tablet Take 250 mg by mouth 2 (two) times daily.   Yes [provider]  loperamide (IMODIUM) 2 MG capsule Take 2 mg by mouth as needed for diarrhea or loose stools.   Yes [provider]  LORazepam (ATIVAN) 0.5 MG tablet Take 0.5 mg by mouth every Monday, Wednesday, and Friday. Give .5mg  by mouth every Monday, Wednesday, and Friday 30 minutes prior to shower   Yes [provider]  losartan (COZAAR) 50 MG tablet Take 50 mg by mouth daily.   Yes [provider]  magnesium hydroxide (MILK OF MAGNESIA) 400 MG/5ML suspension Take 30 mLs by mouth at bedtime as needed for mild constipation.   Yes [provider]  memantine (NAMENDA) 5 MG tablet Take 5 mg by mouth 2 (two) times daily.   Yes [provider]  metoprolol succinate (TOPROL-XL) 50 MG 24 hr tablet Take 75 mg by mouth daily. Take with or immediately following a meal.   Yes [provider]  Multiple Vitamins-Minerals (PRESERVISION AREDS PO) Take 1 capsule by mouth 2 (two) times  daily.   Yes [provider]  neomycin-bacitracin-polymyxin (NEOSPORIN) [phone] ointment Apply 1 application topically as needed (skin tears, abrasions).   Yes [provider]  Nutritional Supplements (NUTRITIONAL SHAKE PO) Take 1 Can by mouth 2 (two) times daily with a meal. Mighty shake   Yes [provider]  spironolactone (ALDACTONE) 25 MG tablet Take 25 mg by mouth daily.   Yes [provider]  warfarin (COUMADIN) 3 MG tablet Take 3 mg by mouth 2 (two) times a week. Take 3mg  by mouth on Monday and Thursday   Yes [provider]  warfarin (COUMADIN) 4 MG tablet Take 4 mg by mouth as  directed. Take 4mg  by mouth on Sunday, Tuesday, Wednesday, Friday, and Saturday   Yes [provider]    Family History History reviewed. No pertinent family history.  Social History Social History  Substance Use Topics  . Smoking status: Never Smoker  . Smokeless tobacco: Never Used  . Alcohol use No     Allergies   Patient has no known allergies.   Review of Systems Review of Systems  Unable to perform ROS: Dementia     Physical Exam Updated Vital Signs BP (!) 142/71 (BP Location: Left Arm)   Pulse 70   Temp 97.7 F (36.5 C) (Oral)   Resp 18   Ht 5' (1.524 m)   Wt 60.8 kg (134 lb)   SpO2 99%   BMI 26.17 kg/m   Physical Exam  Constitutional: She is oriented to person, place, and time. She appears well-developed and well-nourished. No distress.  HENT:  Head: Normocephalic and atraumatic.  Eyes: EOM are normal.  Neck: Normal range of motion.  Cardiovascular: Normal rate, regular rhythm and intact distal pulses.   Murmur heard. Pulmonary/Chest: Effort normal and breath sounds normal. No respiratory distress. She has no wheezes. She exhibits no tenderness.  Abdominal: Soft. She exhibits no distension. There is no tenderness.  Musculoskeletal: Normal range of motion.  Lymphadenopathy:    She has no cervical adenopathy.  Neurological: She is alert and oriented to person, place, and time.  Skin: Skin is warm and dry.  Redness of left lower leg from ankle to just below the knee circumferentially. About 4 inches of redness on the anteromedial shin of the right leg. Pedal pulses intact bilaterally. Pitting edema equal bilaterally. Color and warmth of feet equal bilaterally. Patient not displaying any pain with palpation. Patient is able to move feet and knees. Patient is ambulatory with assistance.  Psychiatric: She has a normal mood and affect.  Nursing note and vitals reviewed.    ED Treatments / Results  Labs (all labs ordered are listed, but only  abnormal results are displayed) Labs Reviewed  CBC - Abnormal; Notable for the following:       Result Value   Hemoglobin 11.9 (*)    All other components within normal limits  BASIC METABOLIC PANEL - Abnormal; Notable for the following:    Glucose, Bld 115 (*)    BUN 37 (*)    Creatinine, Ser 1.72 (*)    GFR calc non Af Amer 25 (*)    GFR calc Af Amer 29 (*)    All other components within normal limits  MRSA PCR SCREENING  PROTIME-INR    EKG  EKG Interpretation None       Radiology No results found.  Procedures Procedures (including critical care time)  Medications Ordered in ED Medications  atorvastatin (LIPITOR) tablet 10 mg (not administered)  chlorthalidone (HYGROTON) tablet 50 mg (not administered)  levETIRAcetam (KEPPRA) tablet 250 mg (not administered)  losartan (COZAAR) tablet 50 mg (not administered)  memantine (NAMENDA) tablet 5 mg (not administered)  metoprolol succinate (TOPROL-XL) 24 hr tablet 75 mg (not administered)  spironolactone (ALDACTONE) tablet 25 mg (not administered)  warfarin (COUMADIN) tablet 3 mg (not administered)  warfarin (COUMADIN) tablet 4 mg (not administered)  0.9 %  sodium chloride infusion (not administered)  acetaminophen (TYLENOL) tablet 650 mg (not administered)    Or  acetaminophen (TYLENOL) suppository 650 mg (not administered)  senna-docusate (Senokot-S) tablet 1 tablet (not administered)  ondansetron (ZOFRAN) tablet 4 mg (not administered)    Or  ondansetron (ZOFRAN) injection 4 mg (not administered)  Warfarin - Physician Dosing Inpatient (not administered)  ceFAZolin (ANCEF) IVPB 1 g/50 mL premix (not administered)  ceFAZolin (ANCEF) 500 mg in dextrose 5 % 100 mL IVPB (not administered)  sodium chloride 0.9 % bolus 250 mL (0 mLs Intravenous Stopped 09/03/16 1142)  vancomycin (VANCOCIN) IVPB 1000 mg/200 mL premix (0 mg Intravenous Stopped 09/03/16 1402)     Initial Impression / Assessment and Plan / ED Course  I have  reviewed the triage vital signs and the nursing notes.  Pertinent labs & imaging results that were available during my care of the patient were reviewed by me and considered in my medical decision making (see chart for details).     Patient presenting with worsening/spreading redness of the left lower leg, despite antibiotics. Area is warm to the touch. Concern for worsening cellulitis, with outpatient treatment failure. Discussed case with attending, Dr. Wilson Singer evaluated the patient. Will start patient on IV fluids, and vancomycin. Will order CBC and BMP for further evaluation.  BMP shows worsening SCr at 1.72 (last 1.3 in 05/2016). Otherwise labs are reassuring. Will consult with hospitalist regarding admission for IV antibiotics.  On reassessment, son is in room. He states his mom's leg was very red and swollen beginning on Tuesday of this week. He is unsure if there is any progression. Regarding CODE STATUS, patient's son states mom has a DO NOT RESUSCITATE, but he is power of attorney, and would like some attempt at CPR, but does not want ventilator or intubation.  Patient be admitted to hospitalist service.  Final Clinical Impressions(s) / ED Diagnoses   Final diagnoses:  Cellulitis of left lower extremity  Elevated serum creatinine    New Prescriptions Current Discharge Medication List       Franchot Heidelberg, Hershal Coria 09/03/16 [place]    Virgel Manifold, MD 09/18/16 [phone]

## 2016-09-03 NOTE — ED Triage Notes (Signed)
Patient from [facility] staff noticed redness on Friday. Monday she started on antibotics and there has been no changes since Monday so sent her here to be evaluated. No fevers.

## 2016-09-03 NOTE — Progress Notes (Signed)
Patient needs [place] interpreter when son is not with her

## 2016-09-03 NOTE — Progress Notes (Signed)
[facility] for warfarin Indication: hx afib and MVR  No Known Allergies  Patient Measurements: Height: 5' (152.4 cm) Weight: 134 lb (60.8 kg) IBW/kg (Calculated) : 45.5 Heparin Dosing Weight:   Vital Signs: Temp: 97.7 F (36.5 C) (08/16 1415) Temp Source: Oral (08/16 1415) BP: 142/71 (08/16 1415) Pulse Rate: 70 (08/16 1415)  Labs:  Recent Labs  09/03/16 1107  HGB 11.9*  HCT 37.3  PLT 376  CREATININE 1.72*    Estimated Creatinine Clearance: 18.4 mL/min (A) (by C-G formula based on SCr of 1.72 mg/dL (H)).   Medical History: Past Medical History:  Diagnosis Date  . Dementia   . Hyperlipidemia   . Hypertension   . Seizures ([place])    focal  . Skin cancer     Medications:  PTA warfarin regimen: 4mg  daily except 3 mg on Mon and Thurs  Assessment: Patient's an 83 y.o F with hx MVR on warfarin PTA presented to the ED on 09/05/16 for evaluation and management of LE cellulitis.  To resume warfarin while patient is hospitalized.  Today, 09/03/2016: - INR is therapeutic at 3.12 - hgb 11.9, plt ok - drug-drug intxn: being on abx (ancef) can make patient more sensitive to warfarin  Goal of Therapy:  INR 2.5-3.5 Monitor platelets by anticoagulation protocol: Yes   Plan:  - continue home warfarin regimen of 4mg  daily except 3 mg on Mon and Thursday - daily INR - monitor for s/s bleeding  Jubilee Vivero P 09/03/2016,3:45 PM

## 2016-09-04 ENCOUNTER — Observation Stay (HOSPITAL_BASED_OUTPATIENT_CLINIC_OR_DEPARTMENT_OTHER): Payer: Medicare Other

## 2016-09-04 DIAGNOSIS — R609 Edema, unspecified: Secondary | ICD-10-CM | POA: Diagnosis not present

## 2016-09-04 DIAGNOSIS — I361 Nonrheumatic tricuspid (valve) insufficiency: Secondary | ICD-10-CM

## 2016-09-04 DIAGNOSIS — I371 Nonrheumatic pulmonary valve insufficiency: Secondary | ICD-10-CM

## 2016-09-04 DIAGNOSIS — L03115 Cellulitis of right lower limb: Secondary | ICD-10-CM | POA: Diagnosis not present

## 2016-09-04 LAB — PROTIME-INR
INR: 3.03
PROTHROMBIN TIME: 32.1 s — AB (ref 11.4–15.2)

## 2016-09-04 LAB — COMPREHENSIVE METABOLIC PANEL
ALK PHOS: 107 U/L (ref 38–126)
ALT: 19 U/L (ref 14–54)
ANION GAP: 8 (ref 5–15)
AST: 33 U/L (ref 15–41)
Albumin: 2.8 g/dL — ABNORMAL LOW (ref 3.5–5.0)
BILIRUBIN TOTAL: 1 mg/dL (ref 0.3–1.2)
BUN: 28 mg/dL — ABNORMAL HIGH (ref 6–20)
CALCIUM: 8.5 mg/dL — AB (ref 8.9–10.3)
CO2: 26 mmol/L (ref 22–32)
Chloride: 106 mmol/L (ref 101–111)
Creatinine, Ser: 1.51 mg/dL — ABNORMAL HIGH (ref 0.44–1.00)
GFR calc Af Amer: 34 mL/min — ABNORMAL LOW (ref >60)
GFR, EST NON AFRICAN AMERICAN: 30 mL/min — AB (ref >60)
GLUCOSE: 83 mg/dL (ref 65–99)
POTASSIUM: 3.9 mmol/L (ref 3.5–5.1)
Sodium: 140 mmol/L (ref 135–145)
TOTAL PROTEIN: 6.3 g/dL — AB (ref 6.5–8.1)

## 2016-09-04 LAB — ECHOCARDIOGRAM COMPLETE
HEIGHTINCHES: 60 in
WEIGHTICAEL: 2144 [oz_av]

## 2016-09-04 MED ORDER — SACCHAROMYCES BOULARDII 250 MG PO CAPS: 250.0000 mg | ORAL_CAPSULE | Freq: Two times a day (BID) | ORAL | 0 refills | Status: AC

## 2016-09-04 MED ORDER — DOXYCYCLINE HYCLATE 100 MG PO CAPS: 100.0000 mg | ORAL_CAPSULE | Freq: Two times a day (BID) | ORAL | 0 refills | Status: AC

## 2016-09-04 MED ORDER — LORAZEPAM 2 MG/ML IJ SOLN
INTRAMUSCULAR | Status: AC
  Administered 2016-09-04: 02:00:00
  Filled 2016-09-04: qty 1

## 2016-09-04 MED ORDER — LORAZEPAM 0.5 MG PO TABS
0.5000 mg | ORAL_TABLET | Freq: Two times a day (BID) | ORAL | Status: DC | PRN
  Administered 2016-09-04: 0.5 mg via ORAL
  Filled 2016-09-04: qty 1

## 2016-09-04 MED ORDER — LORAZEPAM 2 MG/ML IJ SOLN
0.5000 mg | Freq: Once | INTRAMUSCULAR | Status: AC
  Administered 2016-09-04: 02:00:00 via INTRAVENOUS

## 2016-09-04 MED ORDER — DOXYCYCLINE HYCLATE 100 MG IV SOLR
100.0000 mg | Freq: Two times a day (BID) | INTRAVENOUS | Status: DC
  Administered 2016-09-04: 100 mg via INTRAVENOUS
  Filled 2016-09-04: qty 100

## 2016-09-04 MED ORDER — FUROSEMIDE 20 MG PO TABS: 20.0000 mg | ORAL_TABLET | Freq: Every day | ORAL | 0 refills | Status: AC | PRN

## 2016-09-04 NOTE — Progress Notes (Signed)
[facility] for warfarin Indication: hx afib and MVR  No Known Allergies  Patient Measurements: Height: 5' (152.4 cm) Weight: 134 lb (60.8 kg) IBW/kg (Calculated) : 45.5 Heparin Dosing Weight:   Vital Signs: Temp: 97.5 F (36.4 C) (08/17 0500) Temp Source: Oral (08/17 0500) BP: 142/63 (08/17 0949) Pulse Rate: 78 (08/17 0949)  Labs:  Recent Labs  09/03/16 1107 09/03/16 1448 09/04/16 0751  HGB 11.9*  --   --   HCT 37.3  --   --   PLT 376  --   --   LABPROT  --  32.8* 32.1*  INR  --  3.12 3.03  CREATININE 1.72*  --  1.51*    Estimated Creatinine Clearance: 21 mL/min (A) (by C-G formula based on SCr of 1.51 mg/dL (H)).   Medical History: Past Medical History:  Diagnosis Date  . Dementia   . Hyperlipidemia   . Hypertension   . Seizures ([place])    focal  . Skin cancer     Medications:  PTA warfarin regimen: 4mg  daily except 3 mg on Mon and Thurs  Assessment: Patient's an 83 y.o F with hx MVR on warfarin PTA presented to the ED on 09/05/16 for evaluation and management of LE cellulitis.  To resume warfarin while patient is hospitalized.  Today, 09/04/2016: - INR is therapeutic at 3.03 - hgb 11.9, plt ok - drug-drug intxn: being on abx (doxycycline) can make patient more sensitive to warfarin  Goal of Therapy:  INR 2.5-3.5 Monitor platelets by anticoagulation protocol: Yes   Plan:  - continue home warfarin regimen of 4mg  daily except 3 mg on Mon and Thursday - continue daily INR while on antibiotics - monitor for s/s bleeding  Dolly Rias RPh 09/04/2016, 10:37 AM Pager [phone]

## 2016-09-04 NOTE — Care Management Obs Status (Signed)
[place] NOTIFICATION   Patient Details  Name: Lisa Maddox MRN: [id number] Date of Birth: [birth year]   Medicare Observation Status Notification Given:  Yes (reviewed with son on telephone)    Guadalupe Maple, RN 09/04/2016, 11:57 AM

## 2016-09-04 NOTE — Progress Notes (Signed)
  Echocardiogram 2D Echocardiogram has been performed.  Darlina Sicilian M 09/04/2016, 12:26 PM

## 2016-09-04 NOTE — Progress Notes (Signed)
Pt checked frequently. Pt has had safety sitter this shift. Patient has pulled out IV access, become visibly upset, despite using translator equipment provided by hospital to converse with patient, equip still provided some lapse and difficulty with translator being able to accurately translate what patient was trying to convey. Pt given one time PRN dose of IV Ativan, after order obtained, but slept only at short intervals. IV restarted per IV team, patient made numerous attempts to remove IV and required frequent redirection. No acute distress noted.

## 2016-09-04 NOTE — Progress Notes (Signed)
**  Preliminary report by tech**  Bilateral lower extremity venous duplex completed. There is no evidence of deep or superficial vein thrombosis involving the right and left lower extremities. All visualized vessels appear patent and compressible. There is no evidence of Baker's cysts bilaterally.  09/04/16 2:49 PM Carlos Levering RVT

## 2016-09-04 NOTE — Discharge Summary (Addendum)
[facility] Discharge Summary   Patient: Lisa Maddox IWP:[id number]   PCP: Sande Brothers, MD DOB: Mar 07, 1926   Date of admission: 09/03/2016   Date of discharge:  09/05/2016    Discharge Diagnoses:  Principal Problem:   Cellulitis of right lower leg Active Problems:   HTN (hypertension)   Lipid disorder   Dementia   AKI (acute kidney injury) ([place])  Admitted From: ALF Disposition:  ALF  Recommendations for Outpatient Follow-up:  1. Please follow up with PCP in 1-2 weeks   Contact information for after-discharge care    Destination    [facility] .   Specialty:  Assisted Living Facility Contact information: [place] [phone]             Diet recommendation: regular diet  Activity: The patient is advised to gradually reintroduce usual activities.  Discharge Condition: good  Code Status: DNR DNI  History of present illness: As per the H and P dictated on admission, "Lisa Maddox is an 83 y.o. female with past medical history significant for hypertension, mitral valve replacement and dementia who was recently moved to [place] from [place] 3 months ago by her son for dementia is sent in from [facility] for lower extremity erythema. History is from the son as patient has dementia and speaks only [place]  Per patient's son, patient was noted to have lower extremity erythema bilaterally 4 days ago by nursing staff at [facility]. He was sent to picture of her legs which she showed to me which shows marketed circumferential erythema in both legs from knees to ankles along with marked edema. He was told that she likely had cellulitis. She was started on by mouth Keflex and Lasix 20 mg by mouth daily by her PA Marlowe Kays who is herPCP at the [facility] and appeared to be improving. However patient's son felt that she needed to be seen in the ED for cellulitis and she was brought in for  evaluation.  Patient herself states she feels well in translation from her son. She denies any pain or discomfort at all."  Hospital Course:  Summary of her active problems in the hospital is as following. CELLULITIS STASIS DERMATITIS with secondary infection  no fever, no leukocytosis, but has warmth, no tenderness and bilateral involvement. I suspect there is at least a component of stasis dermatitis. She has been treated with both Lasix and Keflex at [facility] Pt was started on cefazolin, will switch her to doxycycline  Continue diuresis for edema and once improvement in warmth, pt will benefit from compression stocking. Lower extremity Dopplers negative for DVT  AKI Patient had been initiated on Lasix and last 3 days at [facility] for treatment of lower extremity edema. I suspect this may be contributing to her acute kidney injury.  Change lasix to PRN  LE EDEMA Patient does not seem to tolerate Lasix very well given worsened creatinine.  She may benefit from wrapping of her legs as an outpatient. Lower extremity doppler to rule out DVT  HTN Patient appears to be on heart failure medications including spironolactone, losartan and Toprol-XL.  Continue warfarin for MVR.  Continue Toprol-XL, losartan and chlorthalidone Consider holding chorthalidone if the pt needed lasix.  Echocardiogram shows preserved EF without any wall motion abnormalities or abnormality of the mitral valve.  DEMENTIA Continue Namenda. Patient apparently does well with Ativan half an hour prior to bathing when necessary and this is continued.  unclear why  patient is on Keppra.  continued the medication for now.    All other chronic medical condition were stable during the hospitalization.  Patient was a pt at ALF, transition to which was arranged by Education officer, museum and case Freight forwarder. On the day of the discharge the patient's vitals were stable, and no other acute medical condition were  reported by patient. the patient was felt safe to be discharge at ALF with therapy.  Procedures and Results:  Echocardiogram  Study Conclusions  - Left ventricle: The cavity size was normal. There was moderate   concentric hypertrophy. Systolic function was normal. The   estimated ejection fraction was in the range of 50% to 55%. Wall   motion was normal; there were no regional wall motion   abnormalities. - Aortic valve: Transvalvular velocity was within the normal range.   There was no stenosis. There was mild to moderate regurgitation.   Regurgitation pressure half-time: 531 ms. - Mitral valve: A mechanical prosthesis was present and functioning   normally. Transvalvular velocity was within the normal range.   There was no evidence for stenosis. There was no regurgitation.   Valve area by continuity equation (using LVOT flow): 1.21 cm^2. - Right ventricle: The cavity size was normal. Wall thickness was   normal. Systolic function was normal. - Right atrium: The atrium was severely dilated. - Tricuspid valve: There was moderate regurgitation. - Pulmonic valve: There was moderate regurgitation. - Pulmonary arteries: Systolic pressure was within the normal   range. PA peak pressure: 32 mm Hg (S).   Lower extremity doppler   Preliminary  Bilateral lower extremity venous duplex completed. There is no evidence of deep or superficial vein thrombosis involving the right and left lower extremities. All visualized vessels appear patent and compressible. There is no evidence of Baker's cysts bilaterally.  Consultations:  none  DISCHARGE MEDICATION: Current Discharge Medication List    START taking these medications   Details  doxycycline (VIBRAMYCIN) 100 MG capsule Take 1 capsule (100 mg total) by mouth 2 (two) times daily. Qty: 8 capsule, Refills: 0    saccharomyces boulardii (FLORASTOR) 250 MG capsule Take 1 capsule (250 mg total) by mouth 2 (two) times daily. Qty: 12  capsule, Refills: 0      CONTINUE these medications which have CHANGED   Details  furosemide (LASIX) 20 MG tablet Take 1 tablet (20 mg total) by mouth daily as needed for fluid or edema. Qty: 30 tablet, Refills: 0      CONTINUE these medications which have NOT CHANGED   Details  acetaminophen (TYLENOL) 500 MG tablet Take 500 mg by mouth every 4 (four) hours as needed for mild pain, fever or headache.    alum & mag hydroxide-simeth (MINTOX) 200-200-20 MG/5ML suspension Take 30 mLs by mouth as needed for indigestion or heartburn.    atorvastatin (LIPITOR) 10 MG tablet Take 10 mg by mouth daily.    chlorthalidone (HYGROTON) 50 MG tablet Take 50 mg by mouth daily.    guaifenesin (ROBITUSSIN) 100 MG/5ML syrup Take 200 mg by mouth every 6 (six) hours as needed for cough.    levETIRAcetam (KEPPRA) 250 MG tablet Take 250 mg by mouth 2 (two) times daily.    LORazepam (ATIVAN) 0.5 MG tablet Take 0.5 mg by mouth every Monday, Wednesday, and Friday. Give .5mg  by mouth every Monday, Wednesday, and Friday 30 minutes prior to shower    losartan (COZAAR) 50 MG tablet Take 50 mg by mouth daily.    magnesium hydroxide (  MILK OF MAGNESIA) 400 MG/5ML suspension Take 30 mLs by mouth at bedtime as needed for mild constipation.    memantine (NAMENDA) 5 MG tablet Take 5 mg by mouth 2 (two) times daily.    metoprolol succinate (TOPROL-XL) 50 MG 24 hr tablet Take 75 mg by mouth daily. Take with or immediately following a meal.    Multiple Vitamins-Minerals (PRESERVISION AREDS PO) Take 1 capsule by mouth 2 (two) times daily.    neomycin-bacitracin-polymyxin (NEOSPORIN) [phone] ointment Apply 1 application topically as needed (skin tears, abrasions).    Nutritional Supplements (NUTRITIONAL SHAKE PO) Take 1 Can by mouth 2 (two) times daily with a meal. Mighty shake    spironolactone (ALDACTONE) 25 MG tablet Take 25 mg by mouth daily.    !! warfarin (COUMADIN) 3 MG tablet Take 3 mg by mouth 2 (two)  times a week. Take 3mg  by mouth on Monday and Thursday    !! warfarin (COUMADIN) 4 MG tablet Take 4 mg by mouth as directed. Take 4mg  by mouth on Sunday, Tuesday, Wednesday, Friday, and Saturday     !! - Potential duplicate medications found. Please discuss with provider.    STOP taking these medications     cephALEXin (KEFLEX) 500 MG capsule      loperamide (IMODIUM) 2 MG capsule        No Known Allergies  Discharge Exam: Filed Weights   09/03/16 1013  Weight: 60.8 kg (134 lb)   Vitals:   09/04/16 0500 09/04/16 0949  BP: (!) 139/56 (!) 142/63  Pulse: 84 78  Resp: 18   Temp: (!) 97.5 F (36.4 C)   SpO2: 99%    General: Appear in no distress, lower leg redness right> lef Rash; Oral Mucosa moist. Cardiovascular: S1 and S2 Present, no Murmur, no JVD Respiratory: Bilateral Air entry present and Clear to Auscultation, no Crackles, no wheezes Abdomen: Bowel Sound present, Soft and no tenderness Extremities: bilateral Pedal edema, no calf tenderness Neurology: Grossly no focal neuro deficit.  The results of significant diagnostics from this hospitalization (including imaging, microbiology, ancillary and laboratory) are listed below for reference.    Significant Diagnostic Studies: No results found.  Microbiology: Recent Results (from the past 240 hour(s))  MRSA PCR Screening     Status: None   Collection Time: 09/03/16  3:13 PM  Result Value Ref Range Status   MRSA by PCR NEGATIVE NEGATIVE Final    Comment:        The GeneXpert MRSA Assay (FDA approved for NASAL specimens only), is one component of a comprehensive MRSA colonization surveillance program. It is not intended to diagnose MRSA infection nor to guide or monitor treatment for MRSA infections.      Labs: CBC:  Recent Labs Lab 09/03/16 1107  WBC 7.6  HGB 11.9*  HCT 37.3  MCV 84.6  PLT 784   Basic Metabolic Panel:  Recent Labs Lab 09/03/16 1107 09/04/16 0751  NA 142 140  K 4.1 3.9  CL  105 106  CO2 29 26  GLUCOSE 115* 83  BUN 37* 28*  CREATININE 1.72* 1.51*  CALCIUM 9.0 8.5*   Liver Function Tests:  Recent Labs Lab 09/04/16 0751  AST 33  ALT 19  ALKPHOS 107  BILITOT 1.0  PROT 6.3*  ALBUMIN 2.8*   No results for input(s): LIPASE, AMYLASE in the last 168 hours. No results for input(s): AMMONIA in the last 168 hours. Cardiac Enzymes: No results for input(s): CKTOTAL, CKMB, CKMBINDEX, TROPONINI in the last 168  hours. BNP (last 3 results)  Recent Labs  04/25/16 2127  BNP 106.6*   CBG: No results for input(s): GLUCAP in the last 168 hours. Time spent: 35 minutes  Signed:  Berle Mull  [facility]  09/04/2016  , 12:38 PM

## 2016-09-04 NOTE — Progress Notes (Signed)
Report called to Kennyth Lose at [facility] . IV discontinued. Discharged with son.

## 2016-09-04 NOTE — NC FL2 (Signed)
[place] LEVEL OF CARE SCREENING TOOL     IDENTIFICATION  Patient Name: Lisa Maddox Birthdate: 31-Dec-1926 Sex: female Admission Date (Current Location): 09/03/2016  [facility] and [place] Number:  Herbalist and Address:  [facility], [place] [address] [place], [place] [address]      Provider Number: [id number]  Attending Physician Name and Address:  Lavina Hamman, MD  Relative Name and Phone Number:       Current Level of Care: Hospital Recommended Level of Care: [place], Memory Care Prior Approval Number:    Date Approved/Denied:   PASRR Number:    Discharge Plan: Other (Comment) (ALF Memory Care)    Current Diagnoses: Patient Active Problem List   Diagnosis Date Noted  . Cellulitis of right lower leg 09/04/2016  . Cellulitis and abscess of left leg 09/03/2016  . HTN (hypertension) 09/03/2016  . Lipid disorder 09/03/2016  . Dementia 09/03/2016  . AKI (acute kidney injury) ([place]) 09/03/2016    Orientation RESPIRATION BLADDER Height & Weight        Normal Continent Weight: 134 lb (60.8 kg) Height:  5' (152.4 cm)  BEHAVIORAL SYMPTOMS/MOOD NEUROLOGICAL BOWEL NUTRITION STATUS      Continent Diet (regular)  AMBULATORY STATUS COMMUNICATION OF NEEDS Skin   Limited Assist Verbally Normal                       Personal Care Assistance Level of Assistance  Bathing, Dressing Bathing Assistance: Limited assistance   Dressing Assistance: Limited assistance     Functional Limitations Info             SPECIAL CARE FACTORS FREQUENCY                       Contractures      Additional Factors Info  Code Status, Allergies Code Status Info: DNR Allergies Info: NKA              Discharge Medications: START taking these medications   Details  doxycycline (VIBRAMYCIN) 100 MG capsule Take 1 capsule (100 mg total) by mouth 2 (two) times daily. Qty: 8 capsule, Refills: 0     saccharomyces boulardii (FLORASTOR) 250 MG capsule Take 1 capsule (250 mg total) by mouth 2 (two) times daily. Qty: 12 capsule, Refills: 0          CONTINUE these medications which have CHANGED   Details  furosemide (LASIX) 20 MG tablet Take 1 tablet (20 mg total) by mouth daily as needed for fluid or edema. Qty: 30 tablet, Refills: 0      CONTINUE these medications which have NOT CHANGED   Details  acetaminophen (TYLENOL) 500 MG tablet Take 500 mg by mouth every 4 (four) hours as needed for mild pain, fever or headache.    alum & mag hydroxide-simeth (MINTOX) 200-200-20 MG/5ML suspension Take 30 mLs by mouth as needed for indigestion or heartburn.    atorvastatin (LIPITOR) 10 MG tablet Take 10 mg by mouth daily.    chlorthalidone (HYGROTON) 50 MG tablet Take 50 mg by mouth daily.    guaifenesin (ROBITUSSIN) 100 MG/5ML syrup Take 200 mg by mouth every 6 (six) hours as needed for cough.    levETIRAcetam (KEPPRA) 250 MG tablet Take 250 mg by mouth 2 (two) times daily.    LORazepam (ATIVAN) 0.5 MG tablet Take 0.5 mg by mouth every Monday, Wednesday, and Friday. Give .5mg  by mouth  every Monday, Wednesday, and Friday 30 minutes prior to shower    losartan (COZAAR) 50 MG tablet Take 50 mg by mouth daily.    magnesium hydroxide (MILK OF MAGNESIA) 400 MG/5ML suspension Take 30 mLs by mouth at bedtime as needed for mild constipation.    memantine (NAMENDA) 5 MG tablet Take 5 mg by mouth 2 (two) times daily.    metoprolol succinate (TOPROL-XL) 50 MG 24 hr tablet Take 75 mg by mouth daily. Take with or immediately following a meal.    Multiple Vitamins-Minerals (PRESERVISION AREDS PO) Take 1 capsule by mouth 2 (two) times daily.    neomycin-bacitracin-polymyxin (NEOSPORIN) [phone] ointment Apply 1 application topically as needed (skin tears, abrasions).    Nutritional Supplements (NUTRITIONAL SHAKE PO) Take 1 Can by mouth 2 (two) times daily with a meal. Mighty  shake    spironolactone (ALDACTONE) 25 MG tablet Take 25 mg by mouth daily.    !! warfarin (COUMADIN) 3 MG tablet Take 3 mg by mouth 2 (two) times a week. Take 3mg  by mouth on Monday and Thursday    !! warfarin (COUMADIN) 4 MG tablet Take 4 mg by mouth as directed. Take 4mg  by mouth on Sunday, Tuesday, Wednesday, Friday, and Saturday     !! - Potential duplicate medications found. Please discuss with provider.       STOP taking these medications     cephALEXin (KEFLEX) 500 MG capsule      loperamide (IMODIUM) 2 MG capsule        Relevant Imaging Results:  Relevant Lab Results:   Additional Information    Jorge Ny, LCSW

## 2016-09-04 NOTE — Progress Notes (Signed)
Pt from [facility] and stable to return today per RN.  CSW confirmed plan with pt son who is agreeable to pt return today and plans to drive her back to ALF.  Patient will discharge to [facility] Anticipated discharge date: 8/17 Family notified: pt son Transportation by son- MD requested pt son come around 5pm when tests should have resulted Report #: [id number] ask for RN for AL  CSW signing off.  Jorge Ny, LCSW Clinical Social Worker [phone]

## 2016-09-05 ENCOUNTER — Emergency Department (HOSPITAL_COMMUNITY): Payer: Medicare Other

## 2016-09-05 ENCOUNTER — Encounter (HOSPITAL_COMMUNITY): Payer: Self-pay | Admitting: Emergency Medicine

## 2016-09-05 ENCOUNTER — Emergency Department (HOSPITAL_COMMUNITY)
Admission: EM | Admit: 2016-09-05 | Discharge: 2016-09-06 | Disposition: A | Discharge status: Home / Self Care | Payer: Medicare Other | Attending: Emergency Medicine | Admitting: Emergency Medicine

## 2016-09-05 DIAGNOSIS — Y999 Unspecified external cause status: Secondary | ICD-10-CM | POA: Insufficient documentation

## 2016-09-05 DIAGNOSIS — N189 Chronic kidney disease, unspecified: Secondary | ICD-10-CM

## 2016-09-05 DIAGNOSIS — I129 Hypertensive chronic kidney disease with stage 1 through stage 4 chronic kidney disease, or unspecified chronic kidney disease: Secondary | ICD-10-CM | POA: Insufficient documentation

## 2016-09-05 DIAGNOSIS — Z79899 Other long term (current) drug therapy: Secondary | ICD-10-CM | POA: Insufficient documentation

## 2016-09-05 DIAGNOSIS — Y9301 Activity, walking, marching and hiking: Secondary | ICD-10-CM | POA: Diagnosis not present

## 2016-09-05 DIAGNOSIS — S51012A Laceration without foreign body of left elbow, initial encounter: Secondary | ICD-10-CM

## 2016-09-05 DIAGNOSIS — W19XXXA Unspecified fall, initial encounter: Secondary | ICD-10-CM

## 2016-09-05 DIAGNOSIS — Y92128 Other place in nursing home as the place of occurrence of the external cause: Secondary | ICD-10-CM | POA: Insufficient documentation

## 2016-09-05 DIAGNOSIS — Z7901 Long term (current) use of anticoagulants: Secondary | ICD-10-CM | POA: Insufficient documentation

## 2016-09-05 DIAGNOSIS — F0390 Unspecified dementia without behavioral disturbance: Secondary | ICD-10-CM | POA: Diagnosis not present

## 2016-09-05 DIAGNOSIS — W010XXA Fall on same level from slipping, tripping and stumbling without subsequent striking against object, initial encounter: Secondary | ICD-10-CM | POA: Insufficient documentation

## 2016-09-05 LAB — CBC WITH DIFFERENTIAL/PLATELET
BASOS ABS: 0.1 10*3/uL (ref 0.0–0.1)
BASOS PCT: 1 %
EOS PCT: 4 %
Eosinophils Absolute: 0.3 10*3/uL (ref 0.0–0.7)
HCT: 34.2 % — ABNORMAL LOW (ref 36.0–46.0)
Hemoglobin: 10.8 g/dL — ABNORMAL LOW (ref 12.0–15.0)
Lymphocytes Relative: 18 %
Lymphs Abs: 1.4 10*3/uL (ref 0.7–4.0)
MCH: 26.7 pg (ref 26.0–34.0)
MCHC: 31.6 g/dL (ref 30.0–36.0)
MCV: 84.4 fL (ref 78.0–100.0)
MONO ABS: 0.9 10*3/uL (ref 0.1–1.0)
Monocytes Relative: 11 %
NEUTROS PCT: 66 %
Neutro Abs: 5.3 10*3/uL (ref 1.7–7.7)
PLATELETS: 325 10*3/uL (ref 150–400)
RBC: 4.05 MIL/uL (ref 3.87–5.11)
RDW: 15.1 % (ref 11.5–15.5)
WBC: 7.9 10*3/uL (ref 4.0–10.5)

## 2016-09-05 LAB — BASIC METABOLIC PANEL
ANION GAP: 10 (ref 5–15)
BUN: 33 mg/dL — ABNORMAL HIGH (ref 6–20)
CALCIUM: 8.9 mg/dL (ref 8.9–10.3)
CO2: 25 mmol/L (ref 22–32)
Chloride: 105 mmol/L (ref 101–111)
Creatinine, Ser: 1.84 mg/dL — ABNORMAL HIGH (ref 0.44–1.00)
GFR, EST AFRICAN AMERICAN: 27 mL/min — AB (ref >60)
GFR, EST NON AFRICAN AMERICAN: 23 mL/min — AB (ref >60)
Glucose, Bld: 131 mg/dL — ABNORMAL HIGH (ref 65–99)
Potassium: 3.8 mmol/L (ref 3.5–5.1)
SODIUM: 140 mmol/L (ref 135–145)

## 2016-09-05 LAB — PROTIME-INR
INR: 2.13
PROTHROMBIN TIME: 24.2 s — AB (ref 11.4–15.2)

## 2016-09-05 MED ORDER — MORPHINE SULFATE (PF) 2 MG/ML IV SOLN
2.0000 mg | Freq: Once | INTRAVENOUS | Status: AC
  Administered 2016-09-05: 2 mg via INTRAVENOUS
  Filled 2016-09-05: qty 1

## 2016-09-05 NOTE — ED Notes (Signed)
Pt in CT.

## 2016-09-05 NOTE — ED Notes (Signed)
Bed: WA25 Expected date:  Expected time:  Means of arrival:  Comments: 

## 2016-09-05 NOTE — ED Triage Notes (Signed)
Pt from [facility], fall tonight around 930pm. Pt has a hx of dementia, HTN, AFIB, facial seziure, skin caner, MMV. Pt non ambulatory. DNR. V/s on arrival 142/61, pulse 78, spo2 97, rr16/

## 2016-09-05 NOTE — ED Notes (Signed)
Pt. In XRAY

## 2016-09-05 NOTE — ED Provider Notes (Addendum)
[facility] DEPT Provider Note   CSN: [id number] Arrival date & time: 09/05/16  2218     History   Chief Complaint Chief Complaint  Patient presents with  . Fall    skin tear left elbow pain on right flank     HPI Lisa Maddox is a 83 y.o. female.  HPI Patient presents to the emergency room for evaluation of a fall. Patient has a history of dementia. She is a resident of a nursing facility. She has history of recurrent falls. Patient was found on the ground this evening at about 9:30 PM at the nursing facility. They sent her in for evaluation. A [place] translator was used.  The patient stated that she was walking outside on uneven pavement to get groceries for her children.  Patient states she's having pain in her finger but denies any other complaints. Past Medical History:  Diagnosis Date  . Dementia   . Hyperlipidemia   . Hypertension   . Seizures ([place])    focal  . Skin cancer     Patient Active Problem List   Diagnosis Date Noted  . Cellulitis of right lower leg 09/04/2016  . Cellulitis and abscess of left leg 09/03/2016  . HTN (hypertension) 09/03/2016  . Lipid disorder 09/03/2016  . Dementia 09/03/2016  . AKI (acute kidney injury) ([place]) 09/03/2016    Past Surgical History:  Procedure Laterality Date  . MITRAL VALVE REPLACEMENT      OB History    No data available       Home Medications    Prior to Admission medications   Medication Sig Start Date End Date Taking? Authorizing Provider  acetaminophen (TYLENOL) 500 MG tablet Take 500 mg by mouth every 4 (four) hours as needed for mild pain, fever or headache.    [provider]  alum & mag hydroxide-simeth (MINTOX) 200-200-20 MG/5ML suspension Take 30 mLs by mouth as needed for indigestion or heartburn.    [provider]  atorvastatin (LIPITOR) 10 MG tablet Take 10 mg by mouth daily.    [provider]  chlorthalidone (HYGROTON) 50 MG tablet Take 50 mg by mouth daily.     [provider]  doxycycline (VIBRAMYCIN) 100 MG capsule Take 1 capsule (100 mg total) by mouth 2 (two) times daily. 09/04/16 09/08/16  Lavina Hamman, MD  furosemide (LASIX) 20 MG tablet Take 1 tablet (20 mg total) by mouth daily as needed for fluid or edema. 09/04/16   Lavina Hamman, MD  guaifenesin (ROBITUSSIN) 100 MG/5ML syrup Take 200 mg by mouth every 6 (six) hours as needed for cough.    [provider]  levETIRAcetam (KEPPRA) 250 MG tablet Take 250 mg by mouth 2 (two) times daily.    [provider]  LORazepam (ATIVAN) 0.5 MG tablet Take 0.5 mg by mouth every Monday, Wednesday, and Friday. Give .5mg  by mouth every Monday, Wednesday, and Friday 30 minutes prior to shower    [provider]  losartan (COZAAR) 50 MG tablet Take 50 mg by mouth daily.    [provider]  magnesium hydroxide (MILK OF MAGNESIA) 400 MG/5ML suspension Take 30 mLs by mouth at bedtime as needed for mild constipation.    [provider]  memantine (NAMENDA) 5 MG tablet Take 5 mg by mouth 2 (two) times daily.    [provider]  metoprolol succinate (TOPROL-XL) 50 MG 24 hr tablet Take 75 mg by mouth daily. Take with or immediately following a meal.  [provider]  Multiple Vitamins-Minerals (PRESERVISION AREDS PO) Take 1 capsule by mouth 2 (two) times daily.    [provider]  neomycin-bacitracin-polymyxin (NEOSPORIN) [phone] ointment Apply 1 application topically as needed (skin tears, abrasions).    [provider]  Nutritional Supplements (NUTRITIONAL SHAKE PO) Take 1 Can by mouth 2 (two) times daily with a meal. Mighty shake    [provider]  saccharomyces boulardii (FLORASTOR) 250 MG capsule Take 1 capsule (250 mg total) by mouth 2 (two) times daily. 09/04/16 09/10/16  Lavina Hamman, MD  spironolactone (ALDACTONE) 25 MG tablet Take 25 mg by mouth daily.    [provider]  warfarin (COUMADIN) 3 MG  tablet Take 3 mg by mouth 2 (two) times a week. Take 3mg  by mouth on Monday and Thursday    [provider]  warfarin (COUMADIN) 4 MG tablet Take 4 mg by mouth as directed. Take 4mg  by mouth on Sunday, Tuesday, Wednesday, Friday, and Saturday    [provider]    Family History No family history on file.  Social History Social History  Substance Use Topics  . Smoking status: Never Smoker  . Smokeless tobacco: Never Used  . Alcohol use No     Allergies   Patient has no known allergies.   Review of Systems Review of Systems  All other systems reviewed and are negative.    Physical Exam Updated Vital Signs BP (!) 186/129 (BP Location: Left Arm)   Pulse 87   Temp 97.7 F (36.5 C) (Oral)   Resp 16   SpO2 96%   Physical Exam  Constitutional: No distress.  Elderly, frail  HENT:  Head: Normocephalic and atraumatic.  Right Ear: External ear normal.  Left Ear: External ear normal.  Eyes: Conjunctivae are normal. Right eye exhibits no discharge. Left eye exhibits no discharge. No scleral icterus.  Neck: Neck supple. No tracheal deviation present.  Cardiovascular: Normal rate, regular rhythm and intact distal pulses.   Pulmonary/Chest: Effort normal and breath sounds normal. No stridor. No respiratory distress. She has no wheezes. She has no rales.      Tenderness to palpation posterior inferior, right thoracic region, no crepitus, no contusion or deformity  Abdominal: Soft. Bowel sounds are normal. She exhibits no distension. There is no tenderness. There is no rebound and no guarding.  Musculoskeletal: She exhibits no edema or tenderness.       Right shoulder: She exhibits no tenderness, no bony tenderness and no swelling.       Left shoulder: She exhibits no tenderness, no bony tenderness and no swelling.       Right wrist: She exhibits no tenderness, no bony tenderness and no swelling.       Left wrist: She exhibits no tenderness, no bony tenderness  and no swelling.       Right hip: She exhibits normal range of motion, no tenderness, no bony tenderness and no swelling.       Left hip: She exhibits normal range of motion, no tenderness and no bony tenderness.       Right ankle: She exhibits no swelling. No tenderness.       Left ankle: She exhibits no swelling. No tenderness.       Cervical back: She exhibits no tenderness, no bony tenderness and no swelling.       Thoracic back: She exhibits no tenderness, no bony tenderness and no swelling.       Lumbar back: She exhibits  no tenderness, no bony tenderness and no swelling.       Right hand: Normal.       Left hand: Normal.  Small superficial skin tear left elbow  Neurological: She is alert. She has normal strength. No cranial nerve deficit (no facial droop, extraocular movements intact, no slurred speech) or sensory deficit. She exhibits normal muscle tone. She displays no seizure activity. Coordination normal.  Skin: Skin is warm and dry. No rash noted.  Psychiatric: She has a normal mood and affect.  Nursing note and vitals reviewed.    ED Treatments / Results  Labs (all labs ordered are listed, but only abnormal results are displayed) Labs Reviewed  CBC WITH DIFFERENTIAL/PLATELET - Abnormal; Notable for the following:       Result Value   Hemoglobin 10.8 (*)    HCT 34.2 (*)    All other components within normal limits  BASIC METABOLIC PANEL - Abnormal; Notable for the following:    Glucose, Bld 131 (*)    BUN 33 (*)    Creatinine, Ser 1.84 (*)    GFR calc non Af Amer 23 (*)    GFR calc Af Amer 27 (*)    All other components within normal limits  PROTIME-INR - Abnormal; Notable for the following:    Prothrombin Time 24.2 (*)    All other components within normal limits     Radiology Dg Ribs Unilateral W/chest Right  Result Date: 09/05/2016 CLINICAL DATA:  Fall, injury EXAM: RIGHT RIBS AND CHEST - 3+ VIEW COMPARISON:  04/25/2016 FINDINGS: Single-view chest  demonstrates post sternotomy changes. Cardiomegaly. Aortic atherosclerosis. Central vascular congestion. No pleural effusion. No pneumothorax. Right rib series demonstrates no definite acute displaced right rib fracture. IMPRESSION: 1. Cardiomegaly with mild central congestion. Negative for pleural effusion or pneumothorax 2. No definite acute displaced right rib fracture is seen Electronically Signed   By: Donavan Foil M.D.   On: 09/05/2016 23:38   Dg Pelvis 1-2 Views  Result Date: 09/05/2016 CLINICAL DATA:  Fall EXAM: PELVIS - 1-2 VIEW COMPARISON:  None. FINDINGS: SI joints do not appear widened. Multiple calcified pelvic phleboliths. Pubic symphysis and rami appear intact. No fracture or malalignment. Vascular calcifications. Mild to moderate degenerative changes of both hips. IMPRESSION: No acute osseous abnormality Electronically Signed   By: Donavan Foil M.D.   On: 09/05/2016 23:38   Ct Head Wo Contrast  Result Date: 09/06/2016 CLINICAL DATA:  History of dementia, status post fall. EXAM: CT HEAD WITHOUT CONTRAST CT CERVICAL SPINE WITHOUT CONTRAST TECHNIQUE: Multidetector CT imaging of the head and cervical spine was performed following the standard protocol without intravenous contrast. Multiplanar CT image reconstructions of the cervical spine were also generated. COMPARISON:  06/01/2016 FINDINGS: CT HEAD FINDINGS Brain: No evidence of acute infarction, hemorrhage, hydrocephalus, extra-axial collection or mass lesion/mass effect. Stable encephalomalacia from right MCA territory infarct and bilateral cerebellar infarcts. Moderate brain parenchymal volume loss and periventricular microangiopathy. Vascular: Calcific atherosclerotic disease at the skullbase. Skull: Normal. Negative for fracture or focal lesion. Sinuses/Orbits: No acute finding. Other: None. CT CERVICAL SPINE FINDINGS Alignment: Mild anterior listhesis of C3 on C4, likely degenerative. Skull base and vertebrae: No acute fracture. No  primary bone lesion or focal pathologic process. Soft tissues and spinal canal: No prevertebral fluid or swelling. No visible canal hematoma. Disc levels:  Multilevel osteoarthritic changes. Upper chest: Negative. Other: Choose 1 IMPRESSION: No acute intracranial abnormality. Atrophy, chronic microvascular disease. Stable encephalomalacia from right MCA territory infarct in bilateral  cerebellar infarcts. No evidence of acute traumatic injury to the cervical spine. Multilevel osteoarthritic changes, with minimal anterolisthesis of C3 on C4. Electronically Signed   By: Fidela Salisbury M.D.   On: 09/06/2016 00:12   Ct Cervical Spine Wo Contrast  Result Date: 09/06/2016 CLINICAL DATA:  History of dementia, status post fall. EXAM: CT HEAD WITHOUT CONTRAST CT CERVICAL SPINE WITHOUT CONTRAST TECHNIQUE: Multidetector CT imaging of the head and cervical spine was performed following the standard protocol without intravenous contrast. Multiplanar CT image reconstructions of the cervical spine were also generated. COMPARISON:  06/01/2016 FINDINGS: CT HEAD FINDINGS Brain: No evidence of acute infarction, hemorrhage, hydrocephalus, extra-axial collection or mass lesion/mass effect. Stable encephalomalacia from right MCA territory infarct and bilateral cerebellar infarcts. Moderate brain parenchymal volume loss and periventricular microangiopathy. Vascular: Calcific atherosclerotic disease at the skullbase. Skull: Normal. Negative for fracture or focal lesion. Sinuses/Orbits: No acute finding. Other: None. CT CERVICAL SPINE FINDINGS Alignment: Mild anterior listhesis of C3 on C4, likely degenerative. Skull base and vertebrae: No acute fracture. No primary bone lesion or focal pathologic process. Soft tissues and spinal canal: No prevertebral fluid or swelling. No visible canal hematoma. Disc levels:  Multilevel osteoarthritic changes. Upper chest: Negative. Other: Choose 1 IMPRESSION: No acute intracranial abnormality.  Atrophy, chronic microvascular disease. Stable encephalomalacia from right MCA territory infarct in bilateral cerebellar infarcts. No evidence of acute traumatic injury to the cervical spine. Multilevel osteoarthritic changes, with minimal anterolisthesis of C3 on C4. Electronically Signed   By: Fidela Salisbury M.D.   On: 09/06/2016 00:12    Procedures Procedures (including critical care time)  Medications Ordered in ED Medications  morphine 2 MG/ML injection 2 mg (2 mg Intravenous Given 09/05/16 2338)     Initial Impression / Assessment and Plan / ED Course  I have reviewed the triage vital signs and the nursing notes.  Pertinent labs & imaging results that were available during my care of the patient were reviewed by me and considered in my medical decision making (see chart for details).   Pt presents with a mechanical fall.  Superficial elbow laceration.  Otherwise no significant injuries noted.  Labs stable.  Slight increase in creatinine.  Has been on lasix recently.  Will need to be rechecked.  Follow up with PCP.  Initial BP was normal.  Second was significantly elevated.  Doubt clinically accurate.  Nursing will repeat  Findings discussed with son  Final Clinical Impressions(s) / ED Diagnoses   Final diagnoses:  Fall, initial encounter  Laceration of left elbow, initial encounter  Chronic renal impairment, unspecified CKD stage    New Prescriptions New Prescriptions   No medications on file       Dorie Rank, MD 09/06/16 [phone]

## 2016-09-06 NOTE — Discharge Instructions (Signed)
Follow-up with your primary care doctor to have the renal function re checked

## 2017-01-27 ENCOUNTER — Other Ambulatory Visit: Payer: Self-pay

## 2017-01-27 ENCOUNTER — Emergency Department (HOSPITAL_COMMUNITY)
Admission: EM | Admit: 2017-01-27 | Discharge: 2017-01-27 | Disposition: A | Discharge status: Home / Self Care | Payer: Medicare Other | Attending: Emergency Medicine | Admitting: Emergency Medicine

## 2017-01-27 ENCOUNTER — Emergency Department (HOSPITAL_COMMUNITY): Payer: Medicare Other

## 2017-01-27 ENCOUNTER — Encounter (HOSPITAL_COMMUNITY): Payer: Self-pay

## 2017-01-27 DIAGNOSIS — R791 Abnormal coagulation profile: Secondary | ICD-10-CM | POA: Diagnosis not present

## 2017-01-27 DIAGNOSIS — Z79899 Other long term (current) drug therapy: Secondary | ICD-10-CM | POA: Insufficient documentation

## 2017-01-27 DIAGNOSIS — M25552 Pain in left hip: Secondary | ICD-10-CM | POA: Insufficient documentation

## 2017-01-27 DIAGNOSIS — I10 Essential (primary) hypertension: Secondary | ICD-10-CM | POA: Diagnosis not present

## 2017-01-27 DIAGNOSIS — F0390 Unspecified dementia without behavioral disturbance: Secondary | ICD-10-CM | POA: Insufficient documentation

## 2017-01-27 LAB — CBC WITH DIFFERENTIAL/PLATELET
BASOS ABS: 0 10*3/uL (ref 0.0–0.1)
BASOS PCT: 0 %
EOS ABS: 0.1 10*3/uL (ref 0.0–0.7)
Eosinophils Relative: 1 %
HEMATOCRIT: 31.1 % — AB (ref 36.0–46.0)
HEMOGLOBIN: 9.7 g/dL — AB (ref 12.0–15.0)
Lymphocytes Relative: 12 %
Lymphs Abs: 1 10*3/uL (ref 0.7–4.0)
MCH: 24.8 pg — ABNORMAL LOW (ref 26.0–34.0)
MCHC: 31.2 g/dL (ref 30.0–36.0)
MCV: 79.5 fL (ref 78.0–100.0)
MONOS PCT: 5 %
Monocytes Absolute: 0.4 10*3/uL (ref 0.1–1.0)
NEUTROS ABS: 6.4 10*3/uL (ref 1.7–7.7)
NEUTROS PCT: 82 %
Platelets: 625 10*3/uL — ABNORMAL HIGH (ref 150–400)
RBC: 3.91 MIL/uL (ref 3.87–5.11)
RDW: 14.5 % (ref 11.5–15.5)
WBC: 7.9 10*3/uL (ref 4.0–10.5)

## 2017-01-27 LAB — I-STAT CHEM 8, ED
BUN: 45 mg/dL — AB (ref 6–20)
CHLORIDE: 99 mmol/L — AB (ref 101–111)
CREATININE: 1.4 mg/dL — AB (ref 0.44–1.00)
Calcium, Ion: 1.08 mmol/L — ABNORMAL LOW (ref 1.15–1.40)
Glucose, Bld: 111 mg/dL — ABNORMAL HIGH (ref 65–99)
HEMATOCRIT: 30 % — AB (ref 36.0–46.0)
Hemoglobin: 10.2 g/dL — ABNORMAL LOW (ref 12.0–15.0)
POTASSIUM: 3 mmol/L — AB (ref 3.5–5.1)
SODIUM: 141 mmol/L (ref 135–145)
TCO2: 29 mmol/L (ref 22–32)

## 2017-01-27 LAB — SEDIMENTATION RATE: SED RATE: 114 mm/h — AB (ref 0–22)

## 2017-01-27 LAB — PROTIME-INR
INR: 5.05 — AB
Prothrombin Time: 46.4 seconds — ABNORMAL HIGH (ref 11.4–15.2)

## 2017-01-27 MED ORDER — LACTATED RINGERS IV BOLUS (SEPSIS)
1000.0000 mL | Freq: Once | INTRAVENOUS | Status: AC
  Administered 2017-01-27: 1000 mL via INTRAVENOUS

## 2017-01-27 MED ORDER — POTASSIUM CHLORIDE CRYS ER 20 MEQ PO TBCR
40.0000 meq | EXTENDED_RELEASE_TABLET | Freq: Once | ORAL | Status: AC
  Administered 2017-01-27: 40 meq via ORAL
  Filled 2017-01-27: qty 2

## 2017-01-27 MED ORDER — ACETAMINOPHEN ER 650 MG PO TBCR: 650.0000 mg | EXTENDED_RELEASE_TABLET | Freq: Four times a day (QID) | ORAL | 0 refills | Status: AC

## 2017-01-27 MED ORDER — VITAMIN K1 10 MG/ML IJ SOLN
10.0000 mg | Freq: Once | INTRAVENOUS | Status: AC
  Administered 2017-01-27: 10 mg via INTRAVENOUS
  Filled 2017-01-27: qty 1

## 2017-01-27 MED ORDER — POTASSIUM CHLORIDE CRYS ER 20 MEQ PO TBCR
40.0000 meq | EXTENDED_RELEASE_TABLET | Freq: Once | ORAL | Status: AC
  Administered 2017-01-27: 40 meq via ORAL

## 2017-01-27 NOTE — ED Notes (Signed)
Pt transported home by [facility] 

## 2017-01-27 NOTE — ED Provider Notes (Signed)
[facility] DEPT Provider Note   CSN: [id number] Arrival date & time: 01/27/17  4235     History   Chief Complaint Chief Complaint  Patient presents with  . Extremity Pain    HPI Lisa Maddox is a 84 y.o. female.  HPI 84 year old with history of dementia, hyperlipidemia, lower extremity cellulitis comes in with chief complaint of leg pain.  Level 5 caveat for dementia.  Patient son reports that over the past 1 week he has noticed that patient has had reduced ambulation, and increase pain.  Patient has dementia and unable to communicate appropriately, however it appears to him that patient has been having pain in her legs.  Patient's rehab facility has ordered x-rays which are negative.  A week and a half ago patient was getting regular PT, and was able to walk with a walker.  In August patient had admission for cellulitis, she had DVT workup which was negative.  Past Medical History:  Diagnosis Date  . Dementia   . Hyperlipidemia   . Hypertension   . Seizures ([place])    focal  . Skin cancer     Patient Active Problem List   Diagnosis Date Noted  . Cellulitis of right lower leg 09/04/2016  . Cellulitis and abscess of left leg 09/03/2016  . HTN (hypertension) 09/03/2016  . Lipid disorder 09/03/2016  . Dementia 09/03/2016  . AKI (acute kidney injury) ([place]) 09/03/2016    Past Surgical History:  Procedure Laterality Date  . MITRAL VALVE REPLACEMENT      OB History    No data available       Home Medications    Prior to Admission medications   Medication Sig Start Date End Date Taking? Authorizing Provider  acetaminophen (TYLENOL) 500 MG tablet Take 500 mg by mouth every 4 (four) hours as needed for mild pain, fever or headache.   Yes [provider]  alum & mag hydroxide-simeth (MINTOX) 200-200-20 MG/5ML suspension Take 30 mLs by mouth as needed for indigestion or heartburn.   Yes [provider]    atorvastatin (LIPITOR) 10 MG tablet Take 10 mg by mouth daily.   Yes [provider]  chlorthalidone (HYGROTON) 50 MG tablet Take 50 mg by mouth daily.   Yes [provider]  doxycycline (VIBRAMYCIN) 100 MG capsule Take 100 mg by mouth 2 (two) times daily.   Yes [provider]  furosemide (LASIX) 20 MG tablet Take 1 tablet (20 mg total) by mouth daily as needed for fluid or edema. Patient taking differently: Take 20 mg by mouth daily. For 7 days 09/04/16  Yes Lavina Hamman, MD  guaifenesin (ROBITUSSIN) 100 MG/5ML syrup Take 200 mg by mouth every 6 (six) hours as needed for cough.   Yes [provider]  levETIRAcetam (KEPPRA) 250 MG tablet Take 250 mg by mouth 2 (two) times daily.   Yes [provider]  lidocaine (ASPERCREME W/LIDOCAINE) 4 % cream Apply 1 application topically 2 (two) times daily.   Yes [provider]  loperamide (IMODIUM) 2 MG capsule Take 2 mg by mouth as needed for diarrhea or loose stools.   Yes [provider]  LORazepam (ATIVAN) 0.5 MG tablet Take 0.5 mg by mouth every Monday, Wednesday, and Friday. Give .5mg  by mouth every Monday, Wednesday, and Friday 30 minutes prior to shower   Yes [provider]  losartan (COZAAR) 50 MG tablet Take 50 mg by mouth daily.   Yes [provider]  magnesium hydroxide (MILK OF MAGNESIA) 400 MG/5ML suspension Take 30 mLs by mouth at bedtime as needed for mild constipation.   Yes [provider]  memantine (NAMENDA) 5 MG tablet Take 5 mg by mouth 2 (two) times daily.   Yes [provider]  metoprolol succinate (TOPROL-XL) 50 MG 24 hr tablet Take 75 mg by mouth daily. Take with or immediately following a meal.   Yes [provider]  Multiple Vitamins-Minerals (PRESERVISION AREDS PO) Take 1 capsule by mouth 2 (two) times daily.   Yes [provider]  neomycin-bacitracin-polymyxin (NEOSPORIN) [phone] ointment Apply 1 application  topically as needed (skin tears, abrasions).   Yes [provider]  Nutritional Supplements (NUTRITIONAL SHAKE PO) Take 1 Can by mouth 2 (two) times daily with a meal. Mighty shake   Yes [provider]  warfarin (COUMADIN) 2 MG tablet Take 2 mg by mouth every evening.   Yes [provider]    Family History History reviewed. No pertinent family history.  Social History Social History   Tobacco Use  . Smoking status: Never Smoker  . Smokeless tobacco: Never Used  Substance Use Topics  . Alcohol use: No  . Drug use: No     Allergies   Patient has no known allergies.   Review of Systems Review of Systems  Unable to perform ROS: Dementia     Physical Exam Updated Vital Signs BP 112/62 (BP Location: Left Arm)   Pulse 71   Temp 97.9 F (36.6 C) (Oral)   Resp 16   SpO2 99%   Physical Exam  Constitutional: She appears well-developed.  HENT:  Head: Normocephalic and atraumatic.  Eyes: EOM are normal.  Neck: Normal range of motion. Neck supple.  Cardiovascular: Normal rate.  Pulmonary/Chest: Effort normal.  Abdominal: Bowel sounds are normal.  Musculoskeletal:  Patient appears to have tenderness over the left femur and left hip.  Tenderness worse with passive leg raise.  No gross deformity.  Neurological: She is alert.  Skin: Skin is warm and dry.  Nursing note and vitals reviewed.    ED Treatments / Results  Labs (all labs ordered are listed, but only abnormal results are displayed) Labs Reviewed  I-STAT CHEM 8, ED - Abnormal; Notable for the following components:      Result Value   Potassium 3.0 (*)    Chloride 99 (*)    BUN 45 (*)    Creatinine, Ser 1.40 (*)    Glucose, Bld 111 (*)    Calcium, Ion 1.08 (*)    Hemoglobin 10.2 (*)    HCT 30.0 (*)    All other components within normal limits  CBC WITH DIFFERENTIAL/PLATELET  PROTIME-INR  SEDIMENTATION RATE    EKG  EKG Interpretation None       Radiology No results  found.  Procedures Procedures (including critical care time)  Medications Ordered in ED Medications - No data to display   Initial Impression / Assessment and Plan / ED Course  I have reviewed the triage vital signs and the nursing notes.  Pertinent labs & imaging results that were available during my care of the patient were reviewed by me and considered in my medical decision making (see chart for details).     Patient comes in with chief complaint of weakness, pain.  Patient normally able to ambulate with a walker, however not ambulating for the past week and a half.  Allegedly patient has been complaining of pain, however hard to ascertain where  the pain is.  On my exam patient does appear to have discomfort with passive range of motion of the left hip.  X-rays at the outside facility were negative for fractures.  We will get a CT scan of the pelvis to look for occult fracture.  My suspicion for infectious etiology is low, however if the CT scan is negative then differential diagnosis would include synovitis, bursitis, osteoarthritis -and we will reassess septic arthritis.  Final Clinical Impressions(s) / ED Diagnoses   Final diagnoses:  None    ED Discharge Orders    None       Varney Biles, MD 01/27/17 1139

## 2017-01-27 NOTE — ED Triage Notes (Signed)
EMS reports from [facility] called EMS by med tech with generalized pain and screaming with movement97.9. Pt does not speak english ([place]Prior DX with Cellulitis lower legs. Hx of Dementia and hypertension. Staff med tech poor historian. EMS informed Pt had all morning meds.  BP 110/58 HR 88 Resp 18

## 2017-01-27 NOTE — Discharge Instructions (Signed)
The CT scan of pelvis, x-ray of the hip do not show any fractures.  Lab work in the ER revealed low potassium at 3.0, which was replaced with supplements.  We also noted that the INR was 5, vitamin K was given for that.  -We suspect that the pain is musculoskeletal in nature, possibly cramping due to low potassium, possibly strain from the recent fall.  Please continue with physical therapy evaluation.  If the patient is not getting better, she will need further evaluation of the hip.  Prevent falls.  -For the INR of greater than 5, we recommend stopping Coumadin until Friday, checking INR on Friday and resuming Coumadin as per the INR results.  -We also recommend that Lisa Maddox get Tylenol 650 mg around the clock for the next 3-5 days to see if it helps her with her pain.

## 2017-01-27 NOTE — ED Notes (Signed)
Bed: [facility] Expected date:  Expected time:  Means of arrival:  Comments: 

## 2017-01-27 NOTE — ED Notes (Signed)
[facility] called to check on status and pt is on the list and should be next for transport

## 2017-02-25 ENCOUNTER — Emergency Department (HOSPITAL_COMMUNITY)
Admission: EM | Admit: 2017-02-25 | Discharge: 2017-02-26 | Disposition: A | Discharge status: Home / Self Care | Attending: Emergency Medicine | Admitting: Emergency Medicine

## 2017-02-25 ENCOUNTER — Emergency Department (HOSPITAL_COMMUNITY)

## 2017-02-25 ENCOUNTER — Encounter (HOSPITAL_COMMUNITY): Payer: Self-pay

## 2017-02-25 DIAGNOSIS — S59901A Unspecified injury of right elbow, initial encounter: Secondary | ICD-10-CM | POA: Diagnosis present

## 2017-02-25 DIAGNOSIS — F0390 Unspecified dementia without behavioral disturbance: Secondary | ICD-10-CM | POA: Diagnosis not present

## 2017-02-25 DIAGNOSIS — S51011A Laceration without foreign body of right elbow, initial encounter: Secondary | ICD-10-CM | POA: Insufficient documentation

## 2017-02-25 DIAGNOSIS — I10 Essential (primary) hypertension: Secondary | ICD-10-CM | POA: Diagnosis not present

## 2017-02-25 DIAGNOSIS — Y92199 Unspecified place in other specified residential institution as the place of occurrence of the external cause: Secondary | ICD-10-CM | POA: Insufficient documentation

## 2017-02-25 DIAGNOSIS — W06XXXA Fall from bed, initial encounter: Secondary | ICD-10-CM | POA: Diagnosis not present

## 2017-02-25 DIAGNOSIS — Y939 Activity, unspecified: Secondary | ICD-10-CM | POA: Insufficient documentation

## 2017-02-25 DIAGNOSIS — Y999 Unspecified external cause status: Secondary | ICD-10-CM | POA: Insufficient documentation

## 2017-02-25 DIAGNOSIS — W19XXXA Unspecified fall, initial encounter: Secondary | ICD-10-CM

## 2017-02-25 NOTE — ED Provider Notes (Signed)
[facility] EMERGENCY DEPARTMENT Provider Note  CSN: [id number] Arrival date & time: 02/25/17 2130  Chief Complaint(s) Fall  HPI Lisa Maddox is a 84 y.o. female with a history of dementia who presents from skilled nursing facility after an unwitnessed fall off the bed.  Patient was brought in by EMS. She is alert.   Remainder of history, ROS, and physical exam limited due to patient's condition (dementia). Additional information was obtained from EMS.   Level V Caveat.  Attempted to communicate with the patient with interpreter the history was limited by dementia.  HPI  Past Medical History Past Medical History:  Diagnosis Date  . Dementia   . Hyperlipidemia   . Hypertension   . Seizures (Arthur)    focal  . Skin cancer    Patient Active Problem List   Diagnosis Date Noted  . Cellulitis of right lower leg 09/04/2016  . Cellulitis and abscess of left leg 09/03/2016  . HTN (hypertension) 09/03/2016  . Lipid disorder 09/03/2016  . Dementia 09/03/2016  . AKI (acute kidney injury) (Bernie) 09/03/2016   Home Medication(s) Prior to Admission medications   Medication Sig Start Date End Date Taking? Authorizing Provider  acetaminophen (TYLENOL 8 HOUR) 650 MG CR tablet Take 1 tablet (650 mg total) by mouth every 6 (six) hours. Patient taking differently: Take 500 mg by mouth every 4 (four) hours as needed.  01/27/17  Yes Varney Biles, MD  alum & mag hydroxide-simeth (Andrew) 200-200-20 MG/5ML suspension Take 30 mLs by mouth as needed for indigestion or heartburn.   Yes [provider]  aspirin 325 MG tablet Take 325 mg by mouth daily.   Yes [provider]  atorvastatin (LIPITOR) 10 MG tablet Take 10 mg by mouth daily.   Yes [provider]  chlorthalidone (HYGROTON) 50 MG tablet Take 50 mg by mouth daily.   Yes [provider]  guaifenesin (ROBITUSSIN) 100 MG/5ML syrup Take 200 mg by mouth every 6 (six) hours as needed for  cough.   Yes [provider]  levETIRAcetam (KEPPRA) 250 MG tablet Take 250 mg by mouth 2 (two) times daily.   Yes [provider]  lidocaine (ASPERCREME W/LIDOCAINE) 4 % cream Apply 1 application topically 2 (two) times daily.   Yes [provider]  loperamide (IMODIUM) 2 MG capsule Take 2 mg by mouth as needed for diarrhea or loose stools.   Yes [provider]  LORazepam (ATIVAN) 0.5 MG tablet Take 0.5 mg by mouth every Monday, Wednesday, and Friday. Give .5mg  by mouth every Monday, Wednesday, and Friday 30 minutes prior to shower   Yes [provider]  losartan (COZAAR) 50 MG tablet Take 50 mg by mouth daily.   Yes [provider]  magnesium hydroxide (MILK OF MAGNESIA) 400 MG/5ML suspension Take 30 mLs by mouth at bedtime as needed for mild constipation.   Yes [provider]  metoprolol tartrate (LOPRESSOR) 25 MG tablet Take 25 mg by mouth 2 (two) times daily.   Yes [provider]  Multiple Vitamins-Minerals (PRESERVISION AREDS PO) Take 1 capsule by mouth 2 (two) times daily.   Yes [provider]  neomycin-bacitracin-polymyxin (NEOSPORIN) [phone] ointment Apply 1 application topically as needed (skin tears, abrasions).   Yes [provider]  Nutritional Supplements (NUTRITIONAL SHAKE PO) Take 1 Can by mouth 2 (two) times daily with a meal. Mighty shake   Yes [provider]  traMADol (ULTRAM) 50 MG tablet Take 50 mg by mouth 2 (  two) times daily.   Yes [provider]  traMADol (ULTRAM) 50 MG tablet Take 50 mg by mouth every 6 (six) hours as needed.   Yes [provider]  furosemide (LASIX) 20 MG tablet Take 1 tablet (20 mg total) by mouth daily as needed for fluid or edema. Patient not taking: Reported on 02/25/2017 09/04/16   Lavina Hamman, MD                                                                                                                                      Past Surgical History Past Surgical History:  Procedure Laterality Date  . MITRAL VALVE REPLACEMENT     Family History History reviewed. No pertinent family history.  Social History Social History   Tobacco Use  . Smoking status: Never Smoker  . Smokeless tobacco: Never Used  Substance Use Topics  . Alcohol use: No  . Drug use: No   Allergies Patient has no known allergies.  Review of Systems Review of Systems  Unable to perform ROS: Dementia    Physical Exam Vital Signs  I have reviewed the triage vital signs BP (!) 119/53   Pulse 80   Temp 98.6 F (37 C) (Oral)   Resp 16   Wt 54.4 kg (120 lb)   SpO2 99%   BMI 23.44 kg/m   Physical Exam  Constitutional: She is oriented to person, place, and time. She appears well-developed and well-nourished. No distress.  HENT:  Head: Normocephalic and atraumatic.  Right Ear: External ear normal.  Left Ear: External ear normal.  Nose: Nose normal.  Eyes: Conjunctivae and EOM are normal. Pupils are equal, round, and reactive to light. Right eye exhibits no discharge. Left eye exhibits no discharge. No scleral icterus.  Neck: Normal range of motion. Neck supple.  Cardiovascular: Normal rate, regular rhythm and normal heart sounds. Exam reveals no gallop and no friction rub.  No murmur heard. Pulses:      Radial pulses are 2+ on the right side, and 2+ on the left side.       Dorsalis pedis pulses are 2+ on the right side, and 2+ on the left side.  Pulmonary/Chest: Effort normal and breath sounds normal. No stridor. No respiratory distress. She has no wheezes.  Abdominal: Soft. She exhibits no distension. There is no tenderness.  Musculoskeletal: She exhibits no edema or tenderness.       Right elbow: She exhibits no swelling. No tenderness found.       Cervical back: She exhibits no bony tenderness.       Thoracic back: She exhibits no bony tenderness.       Lumbar back: She exhibits no bony tenderness.  Clavicles  stable. Chest stable to AP/Lat compression. Pelvis stable to Lat compression. No obvious extremity deformity. No chest or abdominal wall contusion.  Neurological: She is alert and oriented to person, place, and  time.  Moving all extremities  Skin: Skin is warm and dry. No rash noted. She is not diaphoretic. No erythema.     Psychiatric: She has a normal mood and affect.    ED Results and Treatments Labs (all labs ordered are listed, but only abnormal results are displayed) Labs Reviewed - No data to display                                                                                                                       EKG  EKG Interpretation  Date/Time:    Ventricular Rate:    PR Interval:    QRS Duration:   QT Interval:    QTC Calculation:   R Axis:     Text Interpretation:        Radiology Ct Head Wo Contrast  Result Date: 02/25/2017 CLINICAL DATA:  Fall.  Pain. EXAM: CT HEAD WITHOUT CONTRAST CT CERVICAL SPINE WITHOUT CONTRAST TECHNIQUE: Multidetector CT imaging of the head and cervical spine was performed following the standard protocol without intravenous contrast. Multiplanar CT image reconstructions of the cervical spine were also generated. COMPARISON:  August 26, 2016 FINDINGS: CT HEAD FINDINGS Brain: No subdural, epidural, or subarachnoid hemorrhage. Tiny lacunar infarcts in the cerebellum are again identified. The brainstem and basal cisterns are normal. 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 No other sites of infarct are noted. White matter changes are again identified. No acute cortical ischemia. No mass effect or midline shift. Prominence of ventricles and sulci is stable. Vascular: Calcified atherosclerotic changes in the intracranial carotids. Skull: Normal. Negative for fracture or focal lesion. Sinuses/Orbits: No acute finding. Other: None. CT CERVICAL SPINE FINDINGS Alignment: Minimal retrolisthesis of C3 versus C4 is stable. Mild  anterolisthesis of T1 versus T2 is stable. No acute malalignment. Skull base and vertebrae: No acute fracture. No primary bone lesion or focal pathologic process. Soft tissues and spinal canal: No prevertebral fluid or swelling. No visible canal hematoma. Disc levels:  Multilevel degenerative changes. Upper chest: Negative. Other: No other abnormalities. IMPRESSION: 1. No acute intracranial abnormality. 2. No fracture or traumatic malalignment in the cervical spine. Electronically Signed   By: Dorise Bullion III M.D   On: 02/25/2017 23:13   Ct Cervical Spine Wo Contrast  Result Date: 02/25/2017 CLINICAL DATA:  Fall.  Pain. EXAM: CT HEAD WITHOUT CONTRAST CT CERVICAL SPINE WITHOUT CONTRAST TECHNIQUE: Multidetector CT imaging of the head and cervical spine was performed following the standard protocol without intravenous contrast. Multiplanar CT image reconstructions of the cervical spine were also generated. COMPARISON:  August 26, 2016 FINDINGS: CT HEAD FINDINGS Brain: No subdural, epidural, or subarachnoid hemorrhage. Tiny lacunar infarcts in the cerebellum are again identified. The brainstem and basal cisterns are normal. 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 No other sites of infarct are noted. White matter changes are again identified. No acute cortical ischemia. No mass effect or midline shift. Prominence of ventricles and sulci is stable. Vascular: Calcified  atherosclerotic changes in the intracranial carotids. Skull: Normal. Negative for fracture or focal lesion. Sinuses/Orbits: No acute finding. Other: None. CT CERVICAL SPINE FINDINGS Alignment: Minimal retrolisthesis of C3 versus C4 is stable. Mild anterolisthesis of T1 versus T2 is stable. No acute malalignment. Skull base and vertebrae: No acute fracture. No primary bone lesion or focal pathologic process. Soft tissues and spinal canal: No prevertebral fluid or swelling. No visible canal hematoma. Disc levels:  Multilevel  degenerative changes. Upper chest: Negative. Other: No other abnormalities. IMPRESSION: 1. No acute intracranial abnormality. 2. No fracture or traumatic malalignment in the cervical spine. Electronically Signed   By: Dorise Bullion III M.D   On: 02/25/2017 23:13   Pertinent labs & imaging results that were available during my care of the patient were reviewed by me and considered in my medical decision making (see chart for details).  Medications Ordered in ED Medications - No data to display                                                                                                                                  Procedures Procedures  (including critical care time)  Medical Decision Making / ED Course I have reviewed the nursing notes for this encounter and the patient's prior records (if available in EHR or on provided paperwork).    Unwitnessed fall from bed.  Patient is alert, pleasant.  Communication difficult due to dementia.  CT scan of head and cervical spine unremarkable.  Patient has a skin tear to the right elbow but no pain with range of motion or palpation.  No other evidence of trauma on exam requiring imaging.  I spoke with the patient's son and informed him of her findings.  She will be discharged back to skilled nursing facility.    Final Clinical Impression(s) / ED Diagnoses Final diagnoses:  Fall, initial encounter  Skin tear of right elbow without complication, initial encounter    Disposition: Discharge  Condition: Good   ED Discharge Orders    None       Follow Up: Sande Brothers, MD [address] [place] [address] [phone]  Schedule an appointment as soon as possible for a visit  As needed     This chart was dictated using voice recognition software.  Despite best efforts to proofread,  errors can occur which can change the documentation meaning.   Fatima Blank, MD 02/25/17 [phone]

## 2017-02-25 NOTE — ED Triage Notes (Signed)
Pt presents by [facility] with report of unwitnessed fall from her bed.  Pt speaks polish, evaluation is limited.

## 2017-02-26 NOTE — ED Notes (Signed)
No distress noted from patient, pt unable to speak [place].

## 2017-04-19 DEATH — deceased

## 2019-07-10 IMAGING — CT CT PELVIS W/O CM
2 of 3 series · 16 of 46 positions shown, 18 images · non-contrast
Comparison: Radiographs 09/05/2016

CLINICAL DATA: Difficulty with ambulation.

EXAM:
CT PELVIS WITHOUT CONTRAST
TECHNIQUE: Multidetector CT imaging of the pelvis was performed following the
standard protocol without intravenous contrast.

[Series 3: axial st · axial · 0.75mm/px · z∈[-196,+22]mm · 13 of 127 slices shown, 15 images]
[im 9/127  soft-tissue]
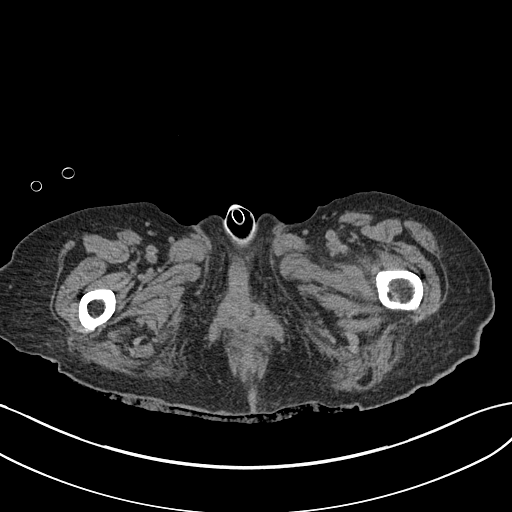
[im 9/127  bone]
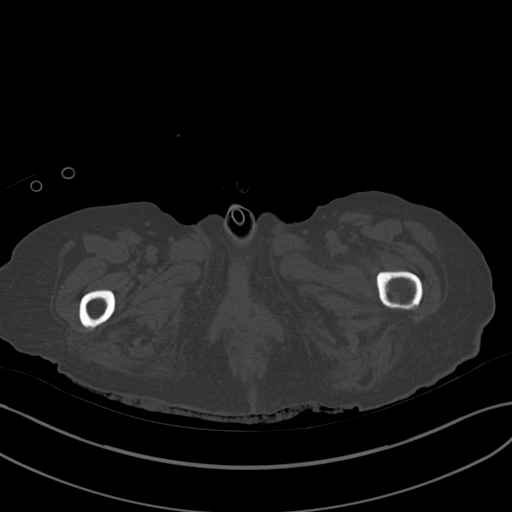
[im 17/127  soft-tissue]
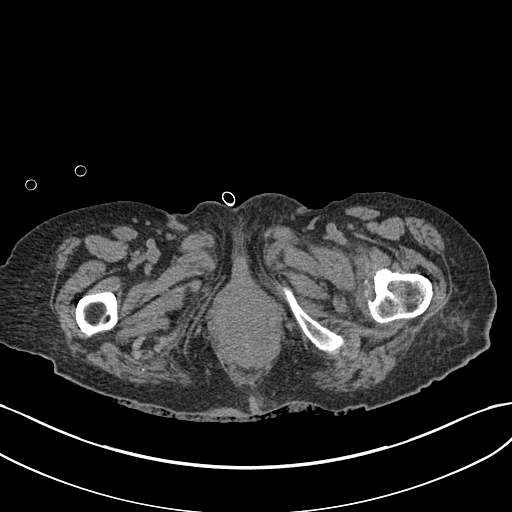
[im 25/127  soft-tissue]
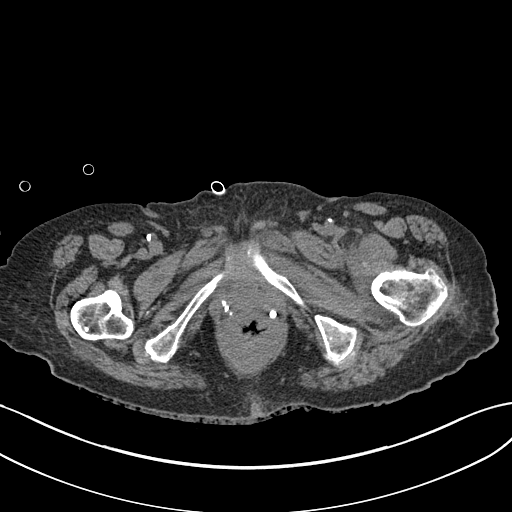
[im 37/127  soft-tissue]
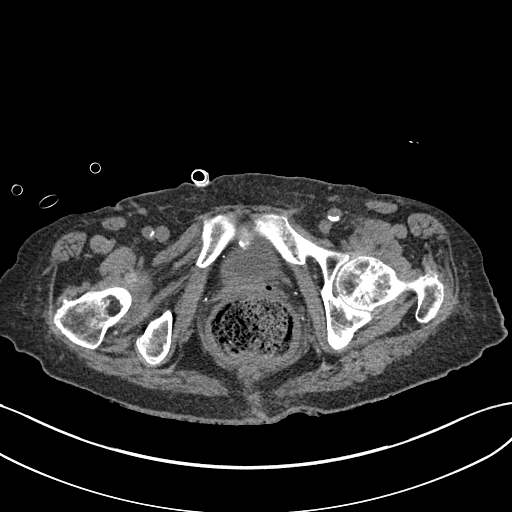
[im 45/127  soft-tissue]
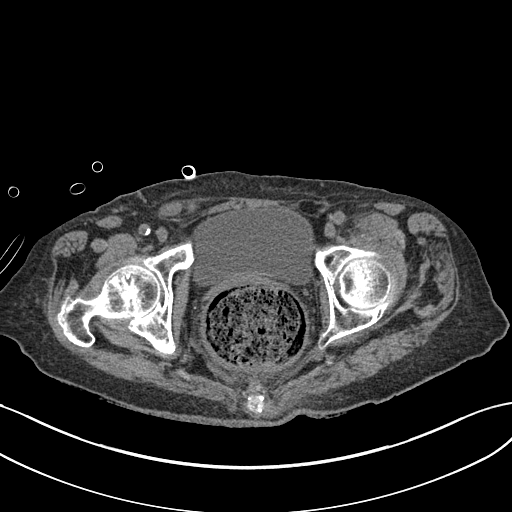
[im 53/127  soft-tissue]
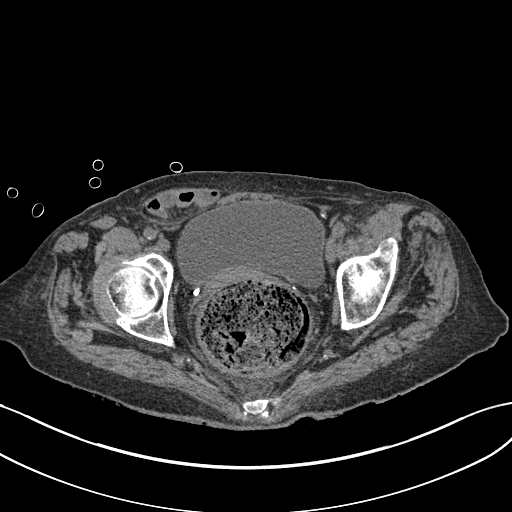
[im 66/127  soft-tissue]
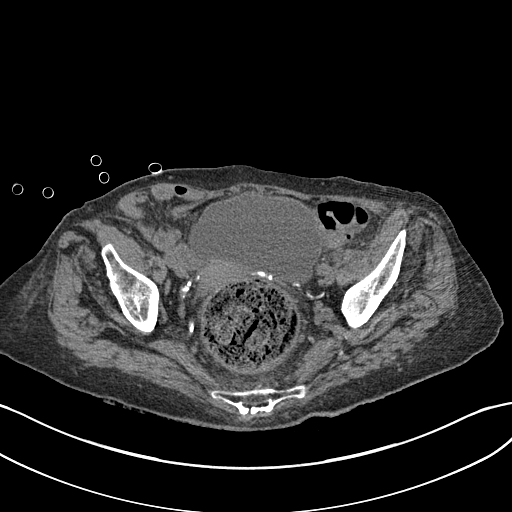
[im 74/127  soft-tissue]
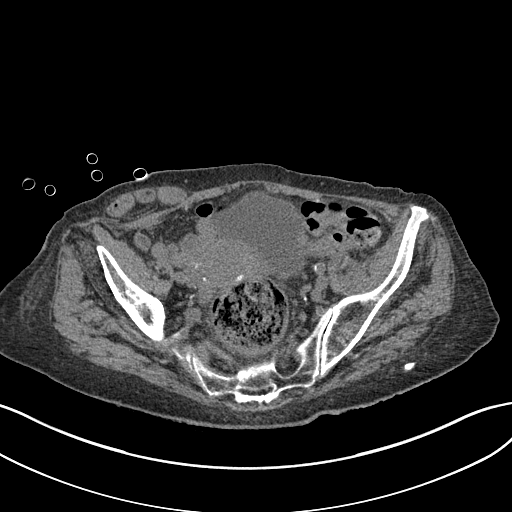
[im 82/127  soft-tissue]
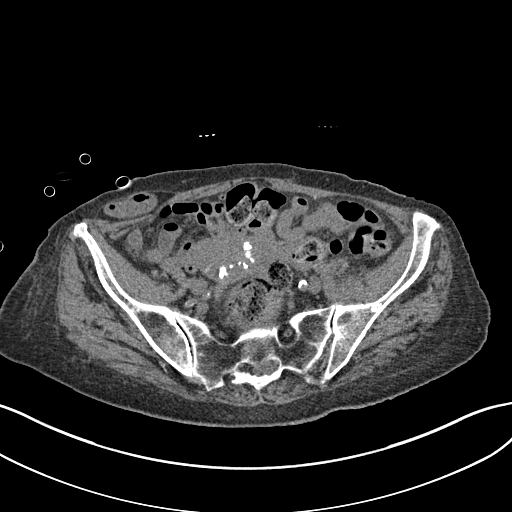
[im 82/127  bone]
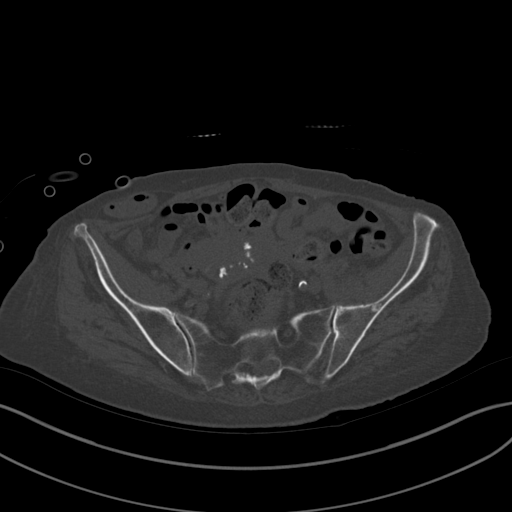
[im 90/127  soft-tissue]
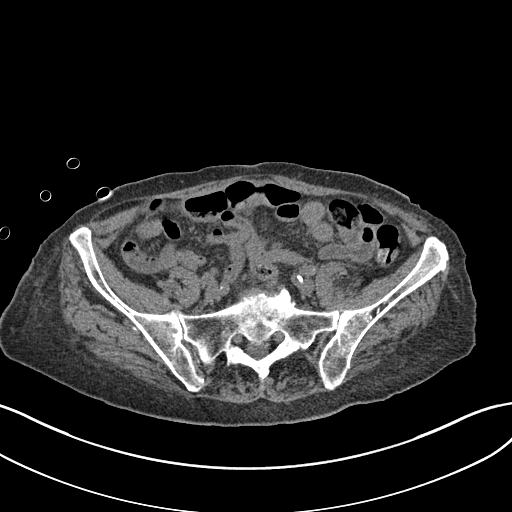
[im 102/127  soft-tissue]
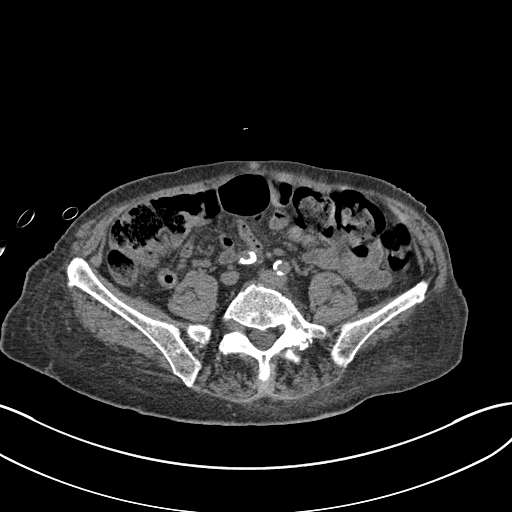
[im 110/127  soft-tissue]
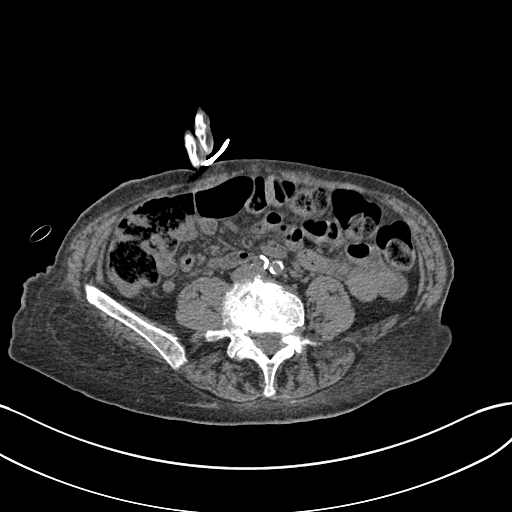
[im 118/127  soft-tissue]
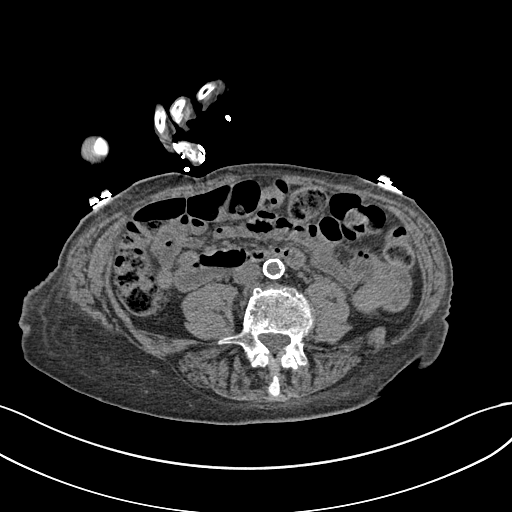

[Series 8: coronal st · coronal · 0.50mm/px · 3 of 97 slices shown]
[im 33/97  soft-tissue]
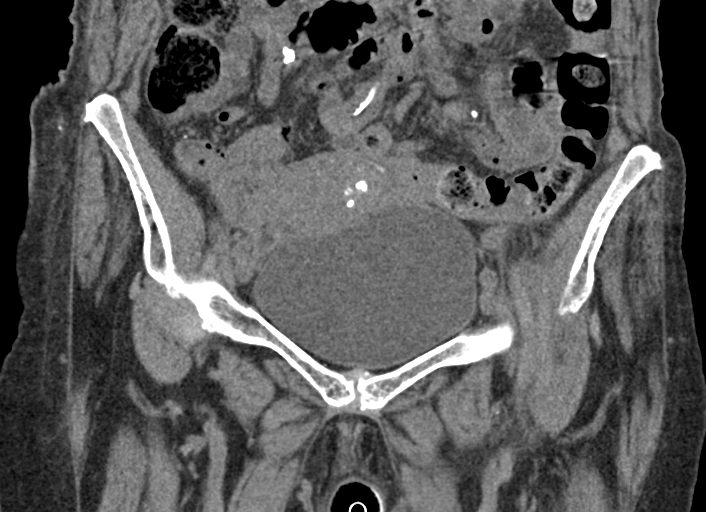
[im 43/97  soft-tissue]
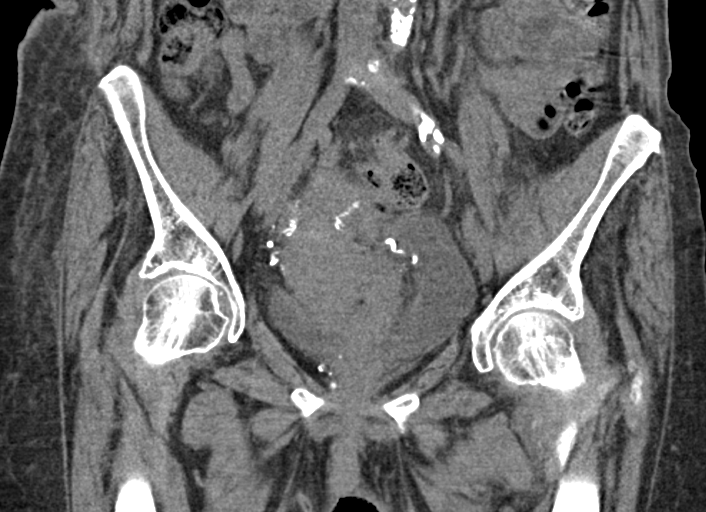
[im 54/97  soft-tissue]
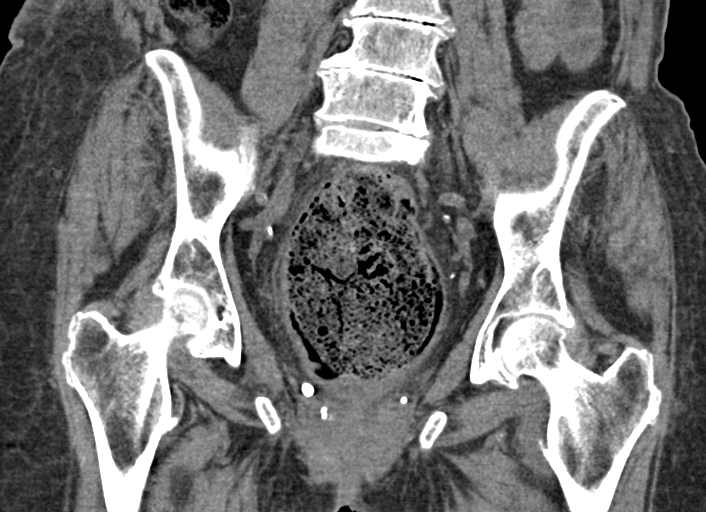

[16 of 46 positions shown; findings below may reference images not displayed]

FINDINGS: Urinary Tract:  The bladder is grossly normal.  No mass or calculi.

Bowel: Large amount of stool in the rectum suggesting fecal
impaction. The visualized small bowel and colon are otherwise
unremarkable.

Vascular/Lymphatic: Advanced atherosclerotic calcifications
involving the distal aorta and iliac arteries. No aneurysm. No
lymphadenopathy.

Reproductive:  The uterus and ovaries are grossly normal.

Other: No free pelvic fluid collections or pelvic mass. No inguinal
adenopathy or inguinal mass. There is a right-sided spigelian type
hernia containing small bowel loops. No obstruction or
incarceration.

Musculoskeletal: Both hips are normally located. Moderate to
advanced degenerative changes but no fracture or AVN. The pubic
symphysis and SI joints are intact. No pelvic fractures or bone
lesions.
IMPRESSION: 1. Moderate to advanced bilateral hip joint degenerative changes but
no fracture or AVN.
2. Intact bony pelvis.
3. Large amount of stool in the rectum suggesting fecal impaction.
4. Low right-sided spigelian type hernia.

## 2019-08-08 IMAGING — CT CT CERVICAL SPINE W/O CM
3 of 5 series · 13 of 33 positions shown, 15 images · non-contrast
Comparison: August 26, 2016

CLINICAL DATA: Fall.  Pain.

EXAM:
CT HEAD WITHOUT CONTRAST
CT CERVICAL SPINE WITHOUT CONTRAST
TECHNIQUE: Multidetector CT imaging of the head and cervical spine was
performed following the standard protocol without intravenous
contrast. Multiplanar CT image reconstructions of the cervical spine
were also generated.

[Series 7: cor soft · coronal · 0.30mm/px · 3 of 67 slices shown]
[im 14/67  bone]
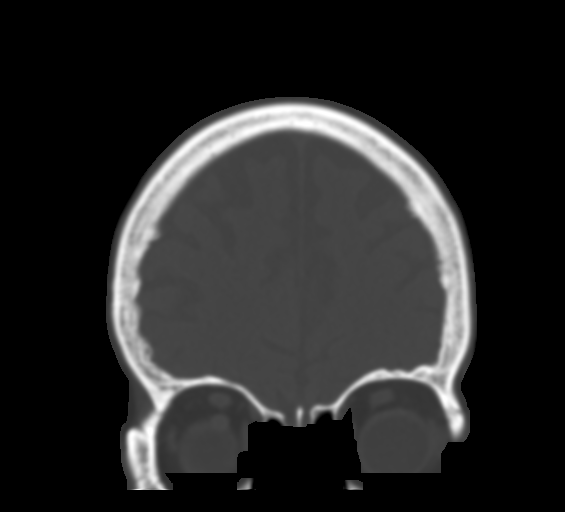
[im 27/67  bone]
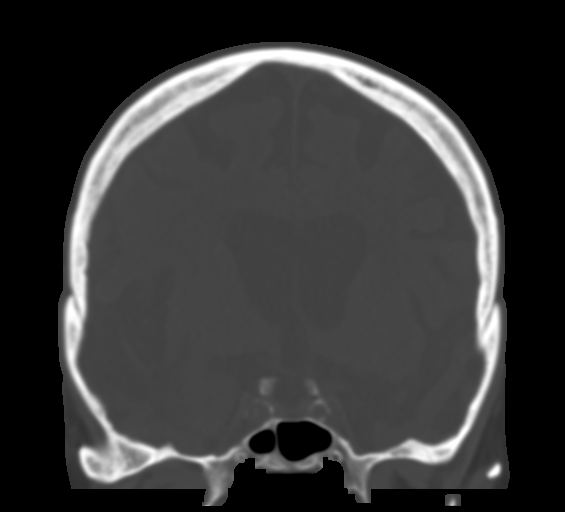
[im 40/67  bone]
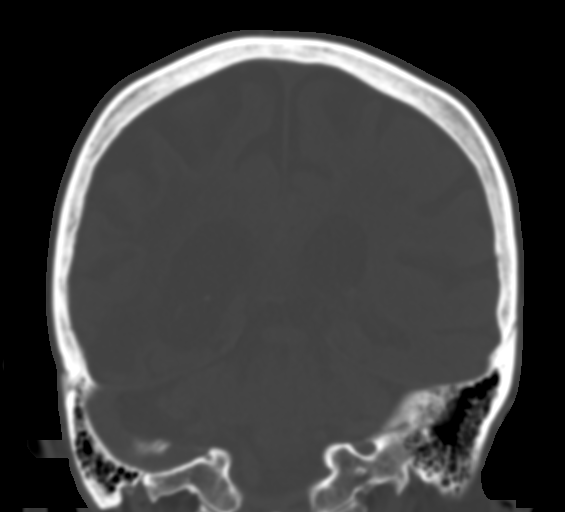

[Series 10: c spine soft · axial · 0.42mm/px · z∈[-208,-108]mm · 5 of 66 slices shown, 7 images]
[im 8/66  soft-tissue]
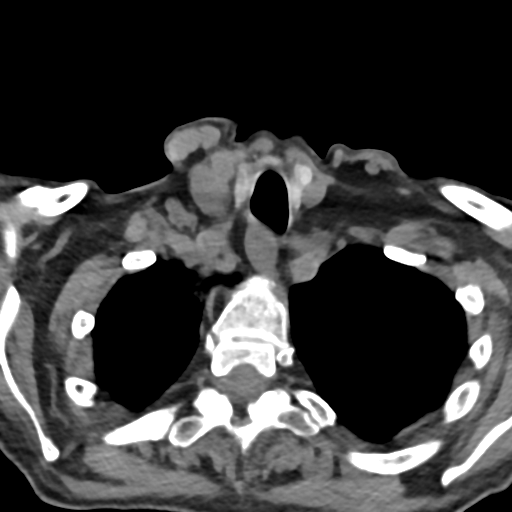
[im 8/66  bone]
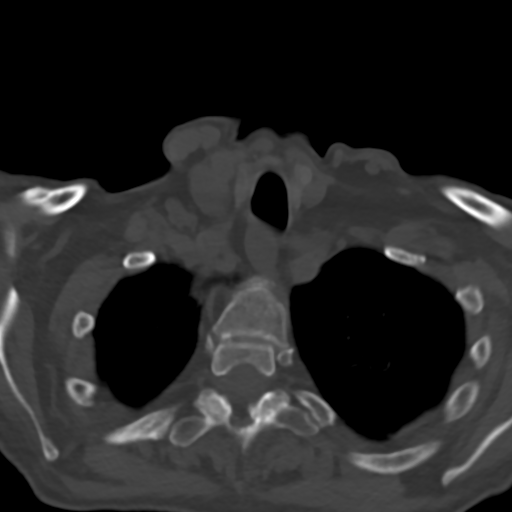
[im 22/66  bone]
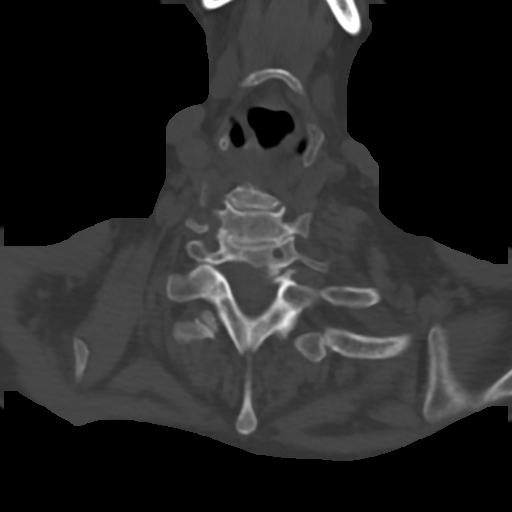
[im 37/66  bone]
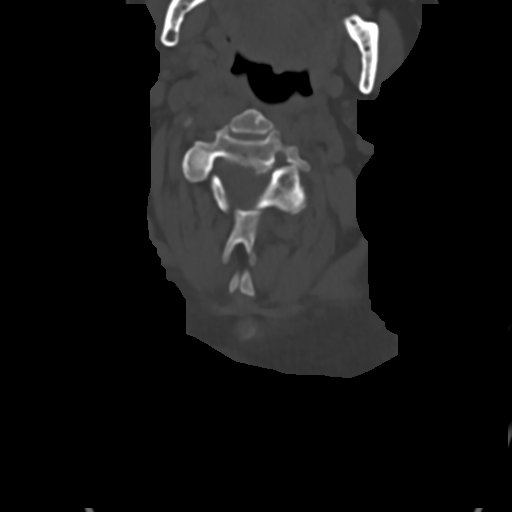
[im 44/66  bone]
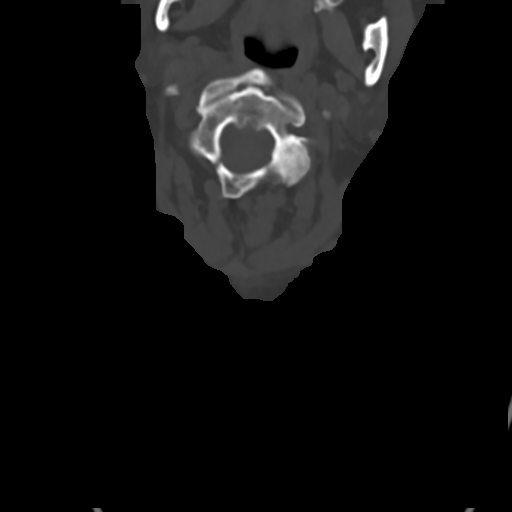
[im 58/66  soft-tissue]
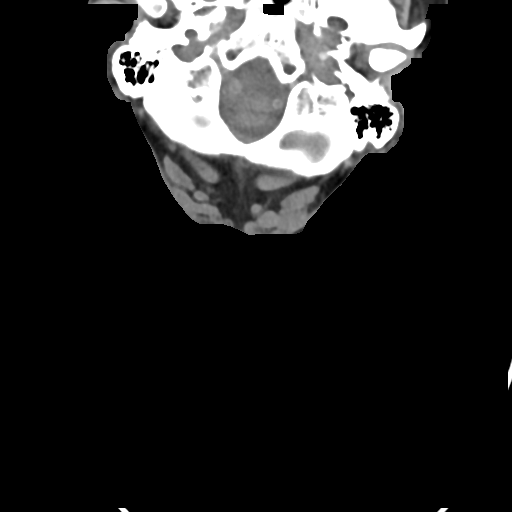
[im 58/66  bone]
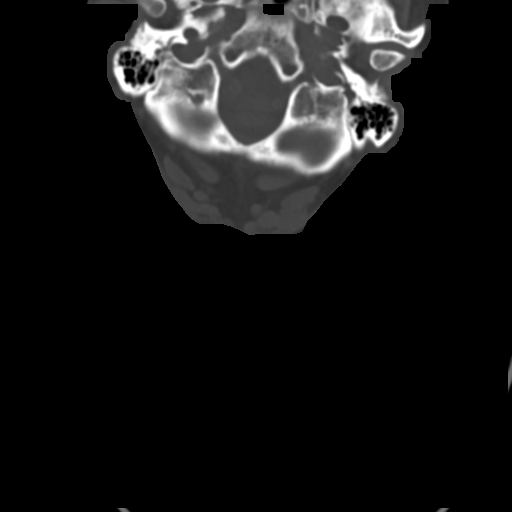

[Series 11: sag bone · sagittal · 0.23mm/px · 5 of 61 slices shown]
[im 11/61  bone]
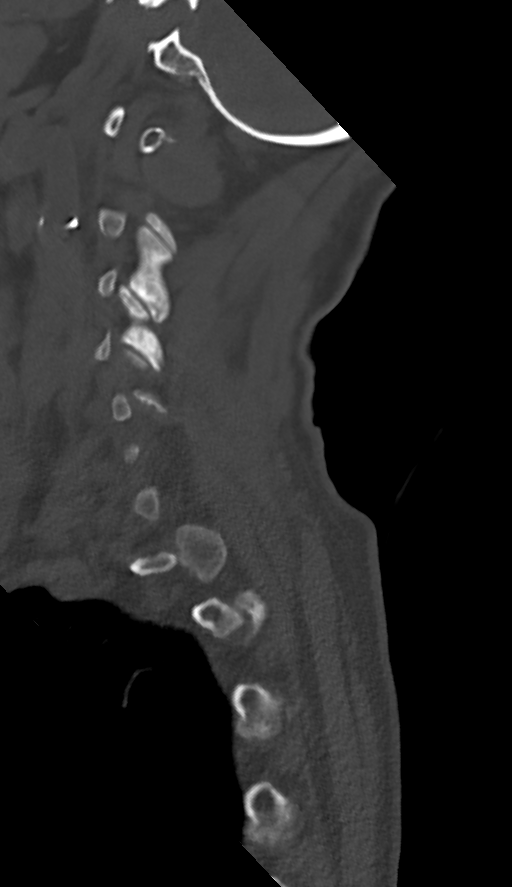
[im 21/61  bone]
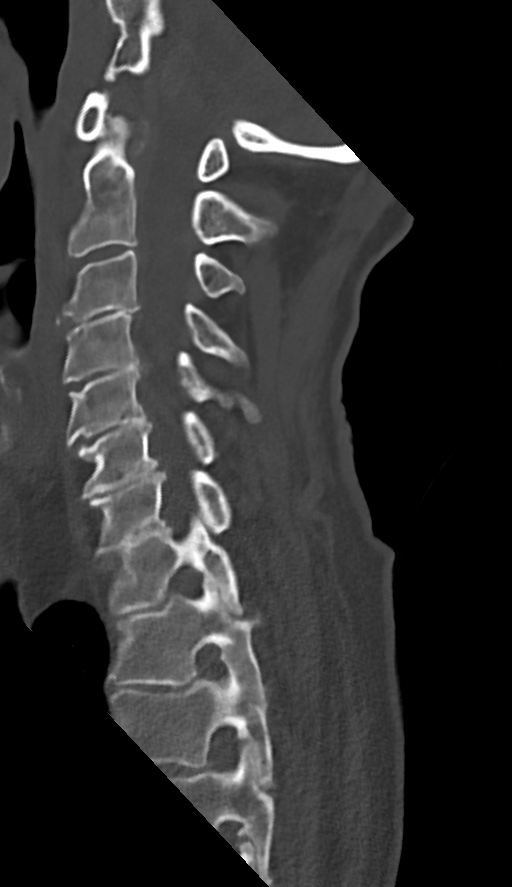
[im 31/61  bone]
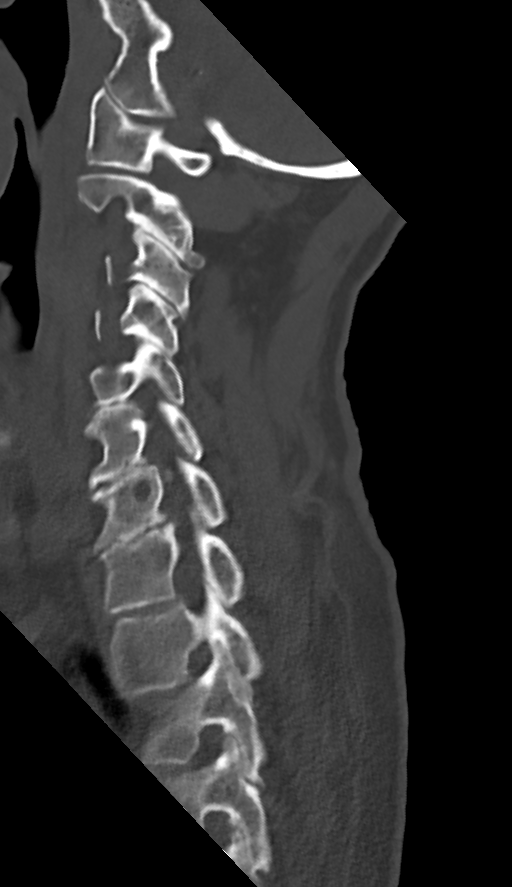
[im 41/61  bone]
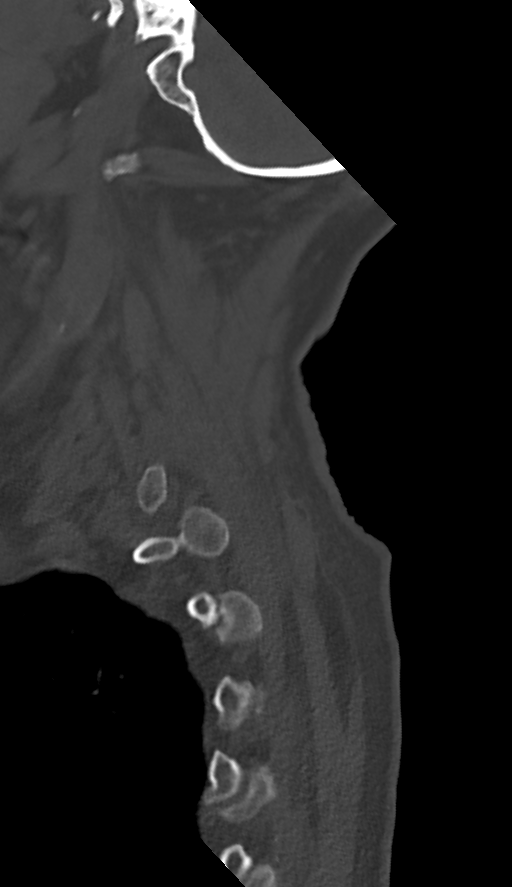
[im 51/61  bone]
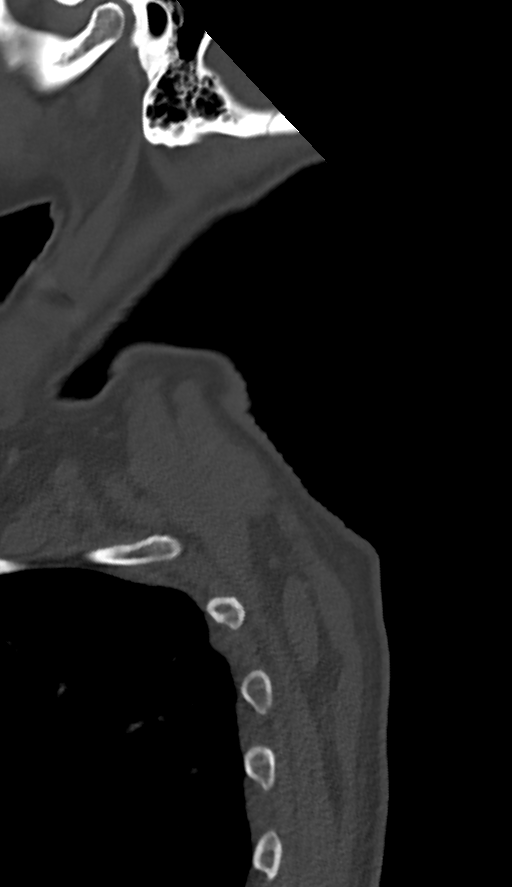

[13 of 33 positions shown; findings below may reference images not displayed]

FINDINGS: CT HEAD FINDINGS

Brain: No subdural, epidural, or subarachnoid hemorrhage. Tiny
lacunar infarcts in the cerebellum are again identified. The
brainstem and basal cisterns are normal. An old right temporal
infarct is noted with encephalomalacia. No other sites of infarct
are noted. White matter changes are again identified. No acute
cortical ischemia. No mass effect or midline shift. Prominence of
ventricles and sulci is stable.

Vascular: Calcified atherosclerotic changes in the intracranial
carotids.

Skull: Normal. Negative for fracture or focal lesion.

Sinuses/Orbits: No acute finding.

Other: None.

CT CERVICAL SPINE FINDINGS

Alignment: Minimal retrolisthesis of C3 versus C4 is stable. Mild
anterolisthesis of T1 versus T2 is stable. No acute malalignment.

Skull base and vertebrae: No acute fracture. No primary bone lesion
or focal pathologic process.

Soft tissues and spinal canal: No prevertebral fluid or swelling. No
visible canal hematoma.

Disc levels:  Multilevel degenerative changes.

Upper chest: Negative.

Other: No other abnormalities.
IMPRESSION: 1. No acute intracranial abnormality.
2. No fracture or traumatic malalignment in the cervical spine.
